# Patient Record
Sex: Female | Born: 2000 | Hispanic: Yes | Marital: Single | Smoking: Former smoker
Health system: Southern US, Community
[De-identification: ages and names within clinical notes are randomized; demographics above are authoritative.]

## PROBLEM LIST (undated history)

## (undated) DIAGNOSIS — T7840XA Allergy, unspecified, initial encounter: Secondary | ICD-10-CM

## (undated) DIAGNOSIS — R51 Headache: Secondary | ICD-10-CM

## (undated) HISTORY — PX: TONSILLECTOMY: SUR1361

---
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
## 2019-01-26 ENCOUNTER — Encounter (HOSPITAL_COMMUNITY): Payer: Self-pay | Admitting: Emergency Medicine

## 2019-01-26 ENCOUNTER — Ambulatory Visit (HOSPITAL_COMMUNITY)
Admission: EM | Admit: 2019-01-26 | Discharge: 2019-01-26 | Disposition: A | Discharge status: Home / Self Care | Payer: Medicaid Other | Attending: Internal Medicine | Admitting: Internal Medicine

## 2019-01-26 DIAGNOSIS — J00 Acute nasopharyngitis [common cold]: Secondary | ICD-10-CM | POA: Diagnosis not present

## 2019-01-26 MED ORDER — IPRATROPIUM BROMIDE 0.06 % NA SOLN: 2.0000 | Freq: Four times a day (QID) | NASAL | 0 refills | Status: DC

## 2019-01-26 MED ORDER — FLUTICASONE PROPIONATE 50 MCG/ACT NA SUSP: 2.0000 | Freq: Every day | NASAL | 0 refills | Status: DC

## 2019-01-26 MED ORDER — CETIRIZINE HCL 10 MG PO TABS: 10.0000 mg | ORAL_TABLET | Freq: Every day | ORAL | 0 refills | Status: DC

## 2019-01-26 NOTE — Discharge Instructions (Signed)
Start flonase, zyrtec, atrovent nasal spray for nasal congestion/drainage. You can use over the counter nasal saline rinse such as neti pot for nasal congestion. Keep hydrated, your urine should be clear to pale yellow in color. Tylenol/motrin for fever and pain. Monitor for any worsening of symptoms, chest pain, shortness of breath, wheezing, swelling of the throat, follow up for reevaluation.   For sore throat/cough try using a honey-based tea. Use 3 teaspoons of honey with juice squeezed from half lemon. Place shaved pieces of ginger into 1/2-1 cup of water and warm over stove top. Then mix the ingredients and repeat every 4 hours as needed.

## 2019-01-26 NOTE — ED Provider Notes (Signed)
[facility]    CSN: [id number] Arrival date & time: 01/26/19  1717     History   Chief Complaint Chief Complaint  Patient presents with  . URI    HPI Rhonda Sparks is a 18 y.o. female.   18 year old female comes in for 2 day history of URI symptoms. Has had nasal congestion, rhinorrhea, cough, sore throat. Denies fever, chills, night sweats. Denies trouble swallowing, trouble breathing, swelling of the throat, tripoding, drooling, trismus. otc cold medicine without relief. States had left over antibiotic and took a few doses without relief.      Past Medical History:  Diagnosis Date  . Allergy    seasonal  . Constipation   . Headache(784.0)    migraines    Patient Active Problem List   Diagnosis Date Noted  . Migraine without aura and without status migrainosus, not intractable 03/26/2015  . New daily persistent headache 03/25/2015  . Migraine with aura and without status migrainosus, not intractable 03/25/2015  . Chronic tension type headache 03/25/2015    Past Surgical History:  Procedure Laterality Date  . FOREIGN BODY REMOVAL  04/18/2012   Procedure: FOREIGN BODY REMOVAL PEDIATRIC;  Surgeon: Harvie Junior, MD;  Location: [facility];  Service: Orthopedics;  Laterality: Left;  left foot 3rd toe  . TONSILLECTOMY     last  year  . TONSILLECTOMY AND ADENOIDECTOMY Bilateral 2012    OB History   No obstetric history on file.      Home Medications    Prior to Admission medications   Medication Sig Start Date End Date Taking? Authorizing Provider  Acetaminophen-Caff-Pyrilamine (MIDOL COMPLETE PO) Take by mouth. Take 2 tablets PRN    [provider]  cetirizine (ZYRTEC) 10 MG tablet Take 1 tablet (10 mg total) by mouth daily. 01/26/19   Cathie Hoops, Eusebia Grulke V, PA-C  fluticasone (FLONASE) 50 MCG/ACT nasal spray Place 2 sprays into both nostrils daily. 01/26/19   Cathie Hoops, Toye Rouillard V, PA-C  ibuprofen (ADVIL,MOTRIN) 600 MG tablet Take 600 mg by mouth  every 6 (six) hours as needed. PRN for headaches    [provider]  ipratropium (ATROVENT) 0.06 % nasal spray Place 2 sprays into both nostrils 4 (four) times daily. 01/26/19   Belinda Fisher, PA-C    Family History Family History  Problem Relation Age of Onset  . Arthritis Mother   . Arthritis Maternal Grandmother   . Depression Maternal Grandmother   . Diabetes Maternal Grandmother   . Hyperlipidemia Maternal Grandmother     Social History Social History   Tobacco Use  . Smoking status: Never Smoker  . Smokeless tobacco: Never Used  Substance Use Topics  . Alcohol use: No    Alcohol/week: 0.0 standard drinks  . Drug use: Yes    Frequency: 2.0 times per week    Types: Marijuana     Allergies   Other   Review of Systems Review of Systems  Reason unable to perform ROS: See HPI as above.     Physical Exam Triage Vital Signs ED Triage Vitals  Enc Vitals Group     BP 01/26/19 1745 129/81     Pulse Rate 01/26/19 1745 (!) 104     Resp 01/26/19 1745 18     Temp 01/26/19 1745 98.3 F (36.8 C)     Temp Source 01/26/19 1745 Oral     SpO2 01/26/19 1745 100 %     Weight --  Height --      Head Circumference --      Peak Flow --      Pain Score 01/26/19 1748 5     Pain Loc --      Pain Edu? --      Excl. in GC? --    No data found.  Updated Vital Signs BP 129/81 (BP Location: Right Arm)   Pulse (!) 104   Temp 98.3 F (36.8 C) (Oral)   Resp 18   SpO2 100%   Physical Exam Constitutional:      General: She is not in acute distress.    Appearance: She is well-developed. She is not ill-appearing, toxic-appearing or diaphoretic.  HENT:     Head: Normocephalic and atraumatic.     Right Ear: Tympanic membrane, ear canal and external ear normal. Tympanic membrane is not erythematous or bulging.     Left Ear: Tympanic membrane, ear canal and external ear normal. Tympanic membrane is not erythematous or bulging.     Nose: Congestion and rhinorrhea present.       Right Sinus: Maxillary sinus tenderness and frontal sinus tenderness present.     Left Sinus: Maxillary sinus tenderness and frontal sinus tenderness present.     Mouth/Throat:     Mouth: Mucous membranes are moist.     Pharynx: Oropharynx is clear. Uvula midline.  Eyes:     Conjunctiva/sclera: Conjunctivae normal.     Pupils: Pupils are equal, round, and reactive to light.  Neck:     Musculoskeletal: Normal range of motion and neck supple.  Cardiovascular:     Rate and Rhythm: Normal rate and regular rhythm.     Heart sounds: Normal heart sounds. No murmur. No friction rub. No gallop.   Pulmonary:     Effort: Pulmonary effort is normal. No accessory muscle usage, prolonged expiration, respiratory distress or retractions.     Breath sounds: Normal breath sounds. No stridor, decreased air movement or transmitted upper airway sounds. No decreased breath sounds, wheezing, rhonchi or rales.  Skin:    General: Skin is warm and dry.  Neurological:     Mental Status: She is alert and oriented to person, place, and time.      UC Treatments / Results  Labs (all labs ordered are listed, but only abnormal results are displayed) Labs Reviewed - No data to display  EKG None  Radiology No results found.  Procedures Procedures (including critical care time)  Medications Ordered in UC Medications - No data to display  Initial Impression / Assessment and Plan / UC Course  I have reviewed the triage vital signs and the nursing notes.  Pertinent labs & imaging results that were available during my care of the patient were reviewed by me and considered in my medical decision making (see chart for details).    Discussed with patient history and exam most consistent with viral URI. Symptomatic treatment as needed. Push fluids. Return precautions given.   Final Clinical Impressions(s) / UC Diagnoses   Final diagnoses:  Acute nasopharyngitis   ED Prescriptions    Medication Sig  Dispense Auth. Provider   fluticasone (FLONASE) 50 MCG/ACT nasal spray Place 2 sprays into both nostrils daily. 1 g Maryjayne Kleven V, PA-C   ipratropium (ATROVENT) 0.06 % nasal spray Place 2 sprays into both nostrils 4 (four) times daily. 15 mL Venice Marcucci V, PA-C   cetirizine (ZYRTEC) 10 MG tablet Take 1 tablet (10 mg total) by mouth daily. 15 tablet  Dorena Cookey, Raiden Haydu V, PA-C 01/26/19 [phone]

## 2019-01-26 NOTE — ED Triage Notes (Signed)
Pt sts URI sx x 2 days  

## 2019-02-27 ENCOUNTER — Other Ambulatory Visit: Payer: Self-pay

## 2019-02-27 ENCOUNTER — Emergency Department (HOSPITAL_COMMUNITY): Payer: Medicaid Other

## 2019-02-27 ENCOUNTER — Emergency Department (HOSPITAL_COMMUNITY)
Admission: EM | Admit: 2019-02-27 | Discharge: 2019-02-27 | Disposition: A | Discharge status: Home / Self Care | Payer: Medicaid Other | Attending: Emergency Medicine | Admitting: Emergency Medicine

## 2019-02-27 DIAGNOSIS — R05 Cough: Secondary | ICD-10-CM | POA: Insufficient documentation

## 2019-02-27 DIAGNOSIS — Z79899 Other long term (current) drug therapy: Secondary | ICD-10-CM | POA: Diagnosis not present

## 2019-02-27 DIAGNOSIS — R059 Cough, unspecified: Secondary | ICD-10-CM

## 2019-02-27 MED ORDER — ALBUTEROL SULFATE HFA 108 (90 BASE) MCG/ACT IN AERS
2.0000 | INHALATION_SPRAY | Freq: Once | RESPIRATORY_TRACT | Status: AC
  Administered 2019-02-27: 2 via RESPIRATORY_TRACT
  Filled 2019-02-27: qty 6.7

## 2019-02-27 MED ORDER — CETIRIZINE HCL 10 MG PO TABS: 10.0000 mg | ORAL_TABLET | Freq: Every day | ORAL | 1 refills | Status: DC

## 2019-02-27 NOTE — ED Provider Notes (Signed)
[facility] EMERGENCY DEPARTMENT Provider Note   CSN: [id number] Arrival date & time: 02/27/19  0050    History   Chief Complaint Chief Complaint  Patient presents with  . Shortness of Breath    HPI Rhonda Sparks is a 18 y.o. female.     The history is provided by the patient and medical records.  Shortness of Breath     18 y.o. F with hx of seasonal allergies, headaches, constipation, presenting to the ED for cough and SOB.  States she has had a cough for about 1 month.  States it is mostly dry but intermittently she will have some clear mucus.  She is also had ongoing nasal congestion for about a month.  She is not had any fever, chills, sick contacts.  No recent travel or known COVID exposures.  She denies any chest pain.  She was seen at urgent care about 1 month ago prescribed "antibiotics" but states she did not finish them because she lost the bottle.  She is not sure exactly what it was.  She has no history of asthma or other baseline respiratory issues.  Past Medical History:  Diagnosis Date  . Allergy    seasonal  . Constipation   . Headache(784.0)    migraines    Patient Active Problem List   Diagnosis Date Noted  . Migraine without aura and without status migrainosus, not intractable 03/26/2015  . New daily persistent headache 03/25/2015  . Migraine with aura and without status migrainosus, not intractable 03/25/2015  . Chronic tension type headache 03/25/2015    Past Surgical History:  Procedure Laterality Date  . FOREIGN BODY REMOVAL  04/18/2012   Procedure: FOREIGN BODY REMOVAL PEDIATRIC;  Surgeon: Harvie Junior, MD;  Location: [facility];  Service: Orthopedics;  Laterality: Left;  left foot 3rd toe  . TONSILLECTOMY     last  year  . TONSILLECTOMY AND ADENOIDECTOMY Bilateral 2012     OB History   No obstetric history on file.      Home Medications    Prior to Admission medications   Medication Sig Start  Date End Date Taking? Authorizing Provider  Acetaminophen-Caff-Pyrilamine (MIDOL COMPLETE PO) Take by mouth. Take 2 tablets PRN    [provider]  cetirizine (ZYRTEC) 10 MG tablet Take 1 tablet (10 mg total) by mouth daily. 01/26/19   Cathie Hoops, Amy V, PA-C  fluticasone (FLONASE) 50 MCG/ACT nasal spray Place 2 sprays into both nostrils daily. 01/26/19   Cathie Hoops, Amy V, PA-C  ibuprofen (ADVIL,MOTRIN) 600 MG tablet Take 600 mg by mouth every 6 (six) hours as needed. PRN for headaches    [provider]  ipratropium (ATROVENT) 0.06 % nasal spray Place 2 sprays into both nostrils 4 (four) times daily. 01/26/19   Belinda Fisher, PA-C    Family History Family History  Problem Relation Age of Onset  . Arthritis Mother   . Arthritis Maternal Grandmother   . Depression Maternal Grandmother   . Diabetes Maternal Grandmother   . Hyperlipidemia Maternal Grandmother     Social History Social History   Tobacco Use  . Smoking status: Never Smoker  . Smokeless tobacco: Never Used  Substance Use Topics  . Alcohol use: No    Alcohol/week: 0.0 standard drinks  . Drug use: Yes    Frequency: 2.0 times per week    Types: Marijuana     Allergies   Other   Review of Systems Review of  Systems  Respiratory: Positive for shortness of breath.   All other systems reviewed and are negative.    Physical Exam Updated Vital Signs BP 125/78   Pulse (!) 104   Temp 98.7 F (37.1 C) (Oral)   Resp (!) 22   LMP 01/20/2019   SpO2 100%   Physical Exam Vitals signs and nursing note reviewed.  Constitutional:      Appearance: She is well-developed.  HENT:     Head: Normocephalic and atraumatic.     Right Ear: Tympanic membrane and ear canal normal.     Left Ear: Tympanic membrane and ear canal normal.     Nose: Congestion present.     Mouth/Throat:     Lips: Pink.     Mouth: Mucous membranes are moist.     Pharynx: Oropharynx is clear.  Eyes:     Conjunctiva/sclera: Conjunctivae normal.      Pupils: Pupils are equal, round, and reactive to light.  Neck:     Musculoskeletal: Normal range of motion.  Cardiovascular:     Rate and Rhythm: Normal rate and regular rhythm.     Heart sounds: Normal heart sounds.  Pulmonary:     Effort: Pulmonary effort is normal.     Breath sounds: Wheezing present.     Comments: Faint end expiratory wheeze, no acute distress, speaking in full sentences without difficulty Abdominal:     General: Bowel sounds are normal.     Palpations: Abdomen is soft.  Musculoskeletal: Normal range of motion.     Comments: Left ankle monitor in place  Skin:    General: Skin is warm and dry.  Neurological:     Mental Status: She is alert and oriented to person, place, and time.      ED Treatments / Results  Labs (all labs ordered are listed, but only abnormal results are displayed) Labs Reviewed - No data to display  EKG None  [facility]  Result Date: 02/27/2019 CLINICAL DATA:  Cough. Shortness of breath. EXAM: PORTABLE CHEST 1 VIEW COMPARISON:  04/26/2017 FINDINGS: The cardiomediastinal contours are normal. The lungs are clear. Pulmonary vasculature is normal. No consolidation, pleural effusion, or pneumothorax. No acute osseous abnormalities are seen. IMPRESSION: Negative AP view of the chest. Electronically Signed   By: Narda RutherfordMelanie  Sanford M.D.   On: 02/27/2019 02:32    Procedures Procedures (including critical care time)  Medications Ordered in ED Medications  albuterol (PROVENTIL HFA;VENTOLIN HFA) 108 (90 Base) MCG/ACT inhaler 2 puff (2 puffs Inhalation Given 02/27/19 0152)     Initial Impression / Assessment and Plan / ED Course  I have reviewed the triage vital signs and the nursing notes.  Pertinent labs & imaging results that were available during my care of the patient were reviewed by me and considered in my medical decision making (see chart for details).  18 year old female here with cough for the past month.  She  is also had some nasal congestion, started having shortness of breath the day.  Seen in urgent care last month and reports she was given "antibiotics" however it appears she was prescribed Zyrtec and some prednisone, however never finished these medications as she lost the bottle.  She is not had any fever, chills, or sick exposures.  No recent travel or known COVID exposures.  Her exam is overall benign here aside from mild nasal congestion and faint end expiratory wheezes.  She was given albuterol inhaler with improvement.  Chest x-ray here  is negative.  She does have history of seasonal allergies and symptoms are more consistent with this.  She is not had any noted fever and I feel she is low risk for COVID.  Will restart allergy medications, can continue albuterol inhaler as needed at home.  Follow-up with PCP.  Return here for any new or acute changes.  Rhonda Sparks was evaluated in Emergency Department on 02/27/2019 for the symptoms described in the history of present illness. She was evaluated in the context of the global COVID-19 pandemic, which necessitated consideration that the patient might be at risk for infection with the SARS-CoV-2 virus that causes COVID-19. Institutional protocols and algorithms that pertain to the evaluation of patients at risk for COVID-19 are in a state of rapid change based on information released by regulatory bodies including the CDC and federal and state organizations. These policies and algorithms were followed during the patient's care in the ED.  Final Clinical Impressions(s) / ED Diagnoses   Final diagnoses:  Cough    ED Discharge Orders         Ordered    cetirizine (ZYRTEC ALLERGY) 10 MG tablet  Daily     02/27/19 0243           Garlon Hatchet, PA-C 02/27/19 0256    Nira Conn, MD 02/28/19 [phone]

## 2019-02-27 NOTE — ED Triage Notes (Signed)
Pt reports she feels SHOB x 1 day. Pt reports cough x 1 month, she was seen at [facility] and given antibiotic but she did not finish them. Pt denies fever or sick contact. Pt reports runny nose.

## 2019-02-27 NOTE — Discharge Instructions (Addendum)
Your chest x-ray was normal.   Re-start the zyrtec-- this is an allergy medication, not antibiotic. You can continue using the nasal spray. Follow-up with your primary care doctor.

## 2019-06-04 IMAGING — DX DG CHEST 2V
1 series · 1 of 1 positions shown · non-contrast
Comparison: Thoracic spine radiographs 07/26/2014

CLINICAL DATA: Generalized chest pain, nonproductive cough, nausea,
and fever for 6 days.

EXAM:
CHEST  2 VIEW

[w chest lat]
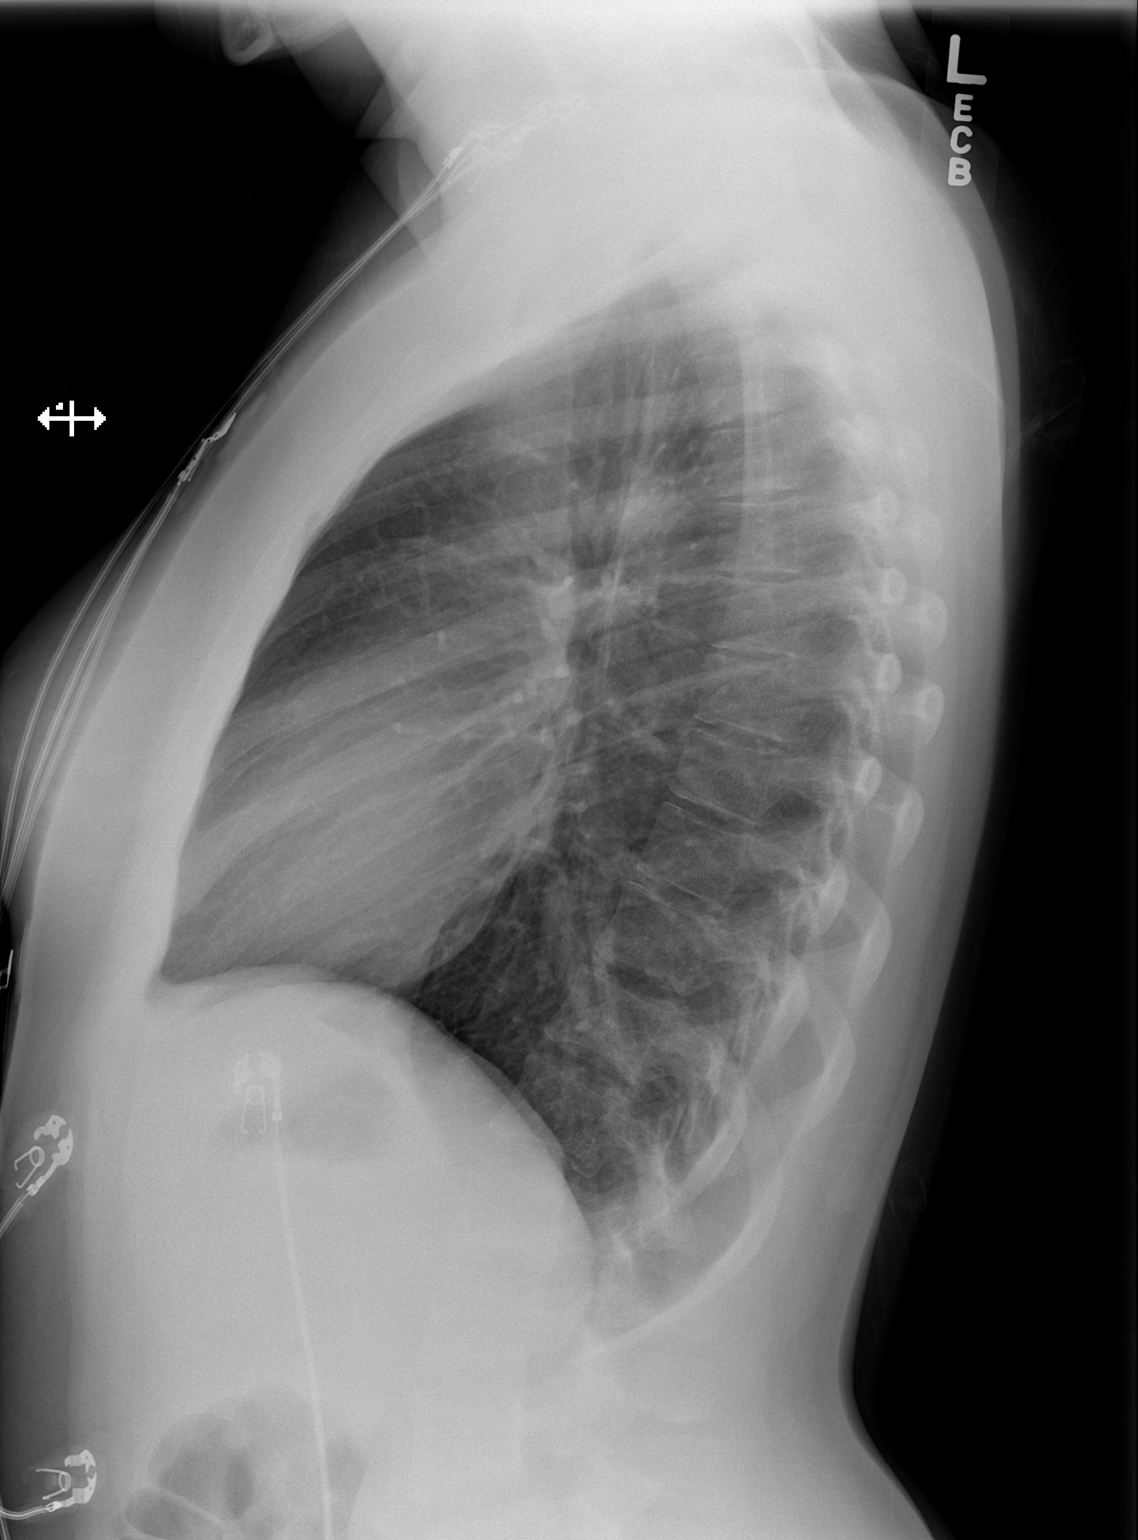

[1 of 1 positions shown; findings below may reference images not displayed]

FINDINGS: The cardiomediastinal silhouette is within normal limits. The lungs
are well inflated and clear. There is no evidence of pleural
effusion or pneumothorax. No acute osseous abnormality is
identified.
IMPRESSION: No active cardiopulmonary disease.

## 2019-08-21 ENCOUNTER — Other Ambulatory Visit: Payer: Self-pay

## 2019-08-21 ENCOUNTER — Encounter (HOSPITAL_COMMUNITY): Payer: Self-pay | Admitting: Emergency Medicine

## 2019-08-21 ENCOUNTER — Emergency Department (HOSPITAL_COMMUNITY): Payer: Medicaid Other

## 2019-08-21 ENCOUNTER — Emergency Department (HOSPITAL_COMMUNITY)
Admission: EM | Admit: 2019-08-21 | Discharge: 2019-08-21 | Disposition: A | Discharge status: Home / Self Care | Payer: Medicaid Other | Attending: Emergency Medicine | Admitting: Emergency Medicine

## 2019-08-21 DIAGNOSIS — Z20828 Contact with and (suspected) exposure to other viral communicable diseases: Secondary | ICD-10-CM | POA: Diagnosis not present

## 2019-08-21 DIAGNOSIS — Z79899 Other long term (current) drug therapy: Secondary | ICD-10-CM | POA: Insufficient documentation

## 2019-08-21 DIAGNOSIS — Z20822 Contact with and (suspected) exposure to covid-19: Secondary | ICD-10-CM

## 2019-08-21 DIAGNOSIS — R0981 Nasal congestion: Secondary | ICD-10-CM | POA: Diagnosis present

## 2019-08-21 MED ORDER — BENZONATATE 100 MG PO CAPS: 100.0000 mg | ORAL_CAPSULE | Freq: Three times a day (TID) | ORAL | 0 refills | Status: DC

## 2019-08-21 MED ORDER — CETIRIZINE HCL 10 MG PO TABS: 10.0000 mg | ORAL_TABLET | Freq: Every day | ORAL | 0 refills | Status: DC

## 2019-08-21 NOTE — ED Notes (Signed)
Pt states she does want to be tested for Covid, will attempt again.

## 2019-08-21 NOTE — ED Provider Notes (Signed)
[facility] EMERGENCY DEPARTMENT Provider Note   CSN: [id number] Arrival date & time: 08/21/19  2111     History   Chief Complaint Chief Complaint  Patient presents with  . Nasal Congestion    HPI Rhonda Sparks is a 18 y.o. female.     The history is provided by the patient. No language interpreter was used.     18 year old female presenting for evaluation of cough and congestion.  Patient report for the past week she endorsed nonproductive cough, sinus congestion, chest congestion, mild throat irritation, decreased taste and smell, occasional sneeze.  She felt her symptoms are similar to her prior bronchitis.  She is an occasional smoker.  She does not complain of fever or chills or body aches.  She denies nausea vomiting or diarrhea.  She denies any recent sick contact with anyone with COVID-19.  She has tried over-the-counter medication at home without adequate relief.  No complaints of shortness of breath or chest pain.  Past Medical History:  Diagnosis Date  . Allergy    seasonal  . Constipation   . Headache(784.0)    migraines    Patient Active Problem List   Diagnosis Date Noted  . Migraine without aura and without status migrainosus, not intractable 03/26/2015  . New daily persistent headache 03/25/2015  . Migraine with aura and without status migrainosus, not intractable 03/25/2015  . Chronic tension type headache 03/25/2015    Past Surgical History:  Procedure Laterality Date  . FOREIGN BODY REMOVAL  04/18/2012   Procedure: FOREIGN BODY REMOVAL PEDIATRIC;  Surgeon: Harvie Junior, MD;  Location: [facility];  Service: Orthopedics;  Laterality: Left;  left foot 3rd toe  . TONSILLECTOMY     last  year  . TONSILLECTOMY AND ADENOIDECTOMY Bilateral 2012     OB History   No obstetric history on file.      Home Medications    Prior to Admission medications   Medication Sig Start Date End Date Taking? Authorizing  Provider  Acetaminophen-Caff-Pyrilamine (MIDOL COMPLETE PO) Take by mouth. Take 2 tablets PRN    [provider]  cetirizine (ZYRTEC ALLERGY) 10 MG tablet Take 1 tablet (10 mg total) by mouth daily. 02/27/19   Garlon Hatchet, PA-C  fluticasone (FLONASE) 50 MCG/ACT nasal spray Place 2 sprays into both nostrils daily. 01/26/19   Cathie Hoops, Amy V, PA-C  ibuprofen (ADVIL,MOTRIN) 600 MG tablet Take 600 mg by mouth every 6 (six) hours as needed. PRN for headaches    [provider]  ipratropium (ATROVENT) 0.06 % nasal spray Place 2 sprays into both nostrils 4 (four) times daily. 01/26/19   Belinda Fisher, PA-C    Family History Family History  Problem Relation Age of Onset  . Arthritis Mother   . Arthritis Maternal Grandmother   . Depression Maternal Grandmother   . Diabetes Maternal Grandmother   . Hyperlipidemia Maternal Grandmother     Social History Social History   Tobacco Use  . Smoking status: Never Smoker  . Smokeless tobacco: Never Used  Substance Use Topics  . Alcohol use: No    Alcohol/week: 0.0 standard drinks  . Drug use: Yes    Frequency: 2.0 times per week    Types: Marijuana     Allergies   Other   Review of Systems Review of Systems   Physical Exam Updated Vital Signs BP 119/64 (BP Location: Right Arm)   Pulse 71   Temp 98.4 F (36.9 C) (  Oral)   Resp 16   LMP 08/14/2019   Physical Exam Vitals signs and nursing note reviewed.  Constitutional:      General: She is not in acute distress.    Appearance: She is well-developed.  HENT:     Head: Atraumatic.     Right Ear: Tympanic membrane normal.     Left Ear: Tympanic membrane normal.     Nose: Nose normal.     Mouth/Throat:     Mouth: Mucous membranes are moist.  Eyes:     Conjunctiva/sclera: Conjunctivae normal.  Neck:     Musculoskeletal: Normal range of motion and neck supple.  Cardiovascular:     Rate and Rhythm: Normal rate and regular rhythm.     Pulses: Normal pulses.     Heart  sounds: Normal heart sounds.  Pulmonary:     Effort: Pulmonary effort is normal.     Breath sounds: Normal breath sounds. No wheezing or rales.  Abdominal:     Palpations: Abdomen is soft.     Tenderness: There is no abdominal tenderness.  Lymphadenopathy:     Cervical: No cervical adenopathy.  Skin:    Findings: No rash.  Neurological:     Mental Status: She is alert.  Psychiatric:        Mood and Affect: Mood normal.      ED Treatments / Results  Labs (all labs ordered are listed, but only abnormal results are displayed) Labs Reviewed - No data to display  EKG None  Radiology Dg Chest Portable 1 View  Result Date: 08/21/2019 CLINICAL DATA:  Cough and congestion EXAM: PORTABLE CHEST 1 VIEW COMPARISON:  February 27, 2019 FINDINGS: The heart size and mediastinal contours are within normal limits. Both lungs are clear. The visualized skeletal structures are unremarkable. IMPRESSION: No acute cardiopulmonary process. Electronically Signed   By: Jonna ClarkBindu  Avutu M.D.   On: 08/21/2019 22:27    Procedures Procedures (including critical care time)  Medications Ordered in ED Medications - No data to display   Initial Impression / Assessment and Plan / ED Course  I have reviewed the triage vital signs and the nursing notes.  Pertinent labs & imaging results that were available during my care of the patient were reviewed by me and considered in my medical decision making (see chart for details).        BP 98/71 (BP Location: Right Arm)   Pulse 75   Temp 98.6 F (37 C) (Oral)   Resp 16   LMP 08/14/2019   SpO2 100%    Final Clinical Impressions(s) / ED Diagnoses   Final diagnoses:  Suspected COVID-19 virus infection    ED Discharge Orders         Ordered    cetirizine (ZYRTEC ALLERGY) 10 MG tablet  Daily     08/21/19 2235    benzonatate (TESSALON) 100 MG capsule  Every 8 hours     08/21/19 2235         10:13 PM Pt here with cold sxs x 1week.  CXR ordered, will  also screen for COVID-19.  Pt otherwise well appearing, no fever, no hypoxia.  [name] was evaluated in Emergency Department on 08/21/2019 for the symptoms described in the history of present illness. She was evaluated in the context of the global COVID-19 pandemic, which necessitated consideration that the patient might be at risk for infection with the SARS-CoV-2 virus that causes COVID-19. Institutional protocols and algorithms that pertain to the evaluation  of patients at risk for COVID-19 are in a state of rapid change based on information released by regulatory bodies including the CDC and federal and state organizations. These policies and algorithms were followed during the patient's care in the ED.    Domenic Moras, PA-C 08/21/19 2236    Blanchie Dessert, MD 08/21/19 2251

## 2019-08-21 NOTE — ED Triage Notes (Signed)
C/o congestion and productive cough with green phlegm x 1 week.

## 2019-08-21 NOTE — Discharge Instructions (Signed)
Your symptoms may be due to a viral upper respiratory infection or it may due to COVID-19.  A test have been sent and you will be notify if test positive in the next 2-3 days.  Follow instruction below.  Return if you have any concerns.  Your Chest Xray is normal today.

## 2019-08-21 NOTE — ED Notes (Signed)
Discharge instructions discussed with Pt. Pt verbalized understanding. Pt stable and ambulatory.    

## 2019-08-21 NOTE — ED Notes (Addendum)
As this RN was about to swab pt for Covid, pt refused. Disregard that sample was clicked off. PA aware.

## 2019-08-22 LAB — SARS CORONAVIRUS 2 (TAT 6-24 HRS): SARS Coronavirus 2: NEGATIVE

## 2019-10-25 ENCOUNTER — Encounter (HOSPITAL_COMMUNITY): Payer: Self-pay | Admitting: Emergency Medicine

## 2019-10-25 ENCOUNTER — Emergency Department (HOSPITAL_COMMUNITY)
Admission: EM | Admit: 2019-10-25 | Discharge: 2019-10-25 | Disposition: A | Discharge status: Home / Self Care | Payer: Medicaid Other | Attending: Emergency Medicine | Admitting: Emergency Medicine

## 2019-10-25 ENCOUNTER — Other Ambulatory Visit: Payer: Self-pay

## 2019-10-25 DIAGNOSIS — Z3A08 8 weeks gestation of pregnancy: Secondary | ICD-10-CM | POA: Insufficient documentation

## 2019-10-25 DIAGNOSIS — Z3490 Encounter for supervision of normal pregnancy, unspecified, unspecified trimester: Secondary | ICD-10-CM

## 2019-10-25 DIAGNOSIS — O99891 Other specified diseases and conditions complicating pregnancy: Secondary | ICD-10-CM | POA: Diagnosis present

## 2019-10-25 LAB — CBC WITH DIFFERENTIAL/PLATELET
Abs Immature Granulocytes: 0.05 10*3/uL (ref 0.00–0.07)
Basophils Absolute: 0 10*3/uL (ref 0.0–0.1)
Basophils Relative: 0 %
Eosinophils Absolute: 0.1 10*3/uL (ref 0.0–0.5)
Eosinophils Relative: 1 %
HCT: 38.8 % (ref 36.0–46.0)
Hemoglobin: 12.9 g/dL (ref 12.0–15.0)
Immature Granulocytes: 0 %
Lymphocytes Relative: 23 %
Lymphs Abs: 3.1 10*3/uL (ref 0.7–4.0)
MCH: 32.4 pg (ref 26.0–34.0)
MCHC: 33.2 g/dL (ref 30.0–36.0)
MCV: 97.5 fL (ref 80.0–100.0)
Monocytes Absolute: 0.7 10*3/uL (ref 0.1–1.0)
Monocytes Relative: 5 %
Neutro Abs: 9.7 10*3/uL — ABNORMAL HIGH (ref 1.7–7.7)
Neutrophils Relative %: 71 %
Platelets: 255 10*3/uL (ref 150–400)
RBC: 3.98 MIL/uL (ref 3.87–5.11)
RDW: 12.9 % (ref 11.5–15.5)
WBC: 13.8 10*3/uL — ABNORMAL HIGH (ref 4.0–10.5)
nRBC: 0 % (ref 0.0–0.2)

## 2019-10-25 LAB — URINALYSIS, ROUTINE W REFLEX MICROSCOPIC
Bacteria, UA: NONE SEEN
Bilirubin Urine: NEGATIVE
Glucose, UA: NEGATIVE mg/dL
Hgb urine dipstick: NEGATIVE
Ketones, ur: 20 mg/dL — AB
Nitrite: NEGATIVE
Protein, ur: NEGATIVE mg/dL
Specific Gravity, Urine: 1.016 (ref 1.005–1.030)
pH: 6 (ref 5.0–8.0)

## 2019-10-25 LAB — HCG, QUANTITATIVE, PREGNANCY: hCG, Beta Chain, Quant, S: 61636 m[IU]/mL — ABNORMAL HIGH (ref <5)

## 2019-10-25 LAB — BASIC METABOLIC PANEL
Anion gap: 10 (ref 5–15)
BUN: 11 mg/dL (ref 6–20)
CO2: 22 mmol/L (ref 22–32)
Calcium: 9.1 mg/dL (ref 8.9–10.3)
Chloride: 102 mmol/L (ref 98–111)
Creatinine, Ser: 0.59 mg/dL (ref 0.44–1.00)
GFR calc Af Amer: >60 mL/min (ref >60)
GFR calc non Af Amer: >60 mL/min (ref >60)
Glucose, Bld: 94 mg/dL (ref 70–99)
Potassium: 3.4 mmol/L — ABNORMAL LOW (ref 3.5–5.1)
Sodium: 134 mmol/L — ABNORMAL LOW (ref 135–145)

## 2019-10-25 NOTE — ED Notes (Signed)
Signature pad not available, pt verbalized understanding of d/c instructions at time of discharge.
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
## 2019-10-28 ENCOUNTER — Ambulatory Visit: Payer: Medicaid Other

## 2019-10-31 LAB — PREGNANCY, URINE: Preg Test, Ur: POSITIVE

## 2019-11-20 NOTE — L&D Delivery Note (Addendum)
OB/GYN Faculty Practice Delivery Note  Rhonda Sparks is a 19 y.o. G1P0 s/p vaginal delivery at [redacted]w[redacted]d. She was admitted for PROM.   SROM: 15h 77m with clear,pink,yellow colored fluid GBS Status: unknown, received adequate PCN ppx    Maximum Maternal Temperature:  Temp (24hrs), Avg:98.4 F (36.9 C), Min:98.1 F (36.7 C), Max:98.7 F (37.1 C)   Labor Progress: Initial SVE: 1/70/-3. Patient received FB and Pitocin and progressed to complete with uncomplicated delivery.   Delivery Date/Time: 05/12/2020 at 1452 Delivery: Called to room and patient was complete and pushing. Head delivered in ROA position. Nuchal cord presentx2. First nuchal reduced followed by delivery of shoulder and body  in usual fashion, followed by reduction of second nuchal. Infant with spontaneous cry, placed on mother's abdomen, dried and stimulated. Cord clamped x 2 after 1-minute delay, and cut by FOB. Cord blood drawn. Placenta delivered spontaneously with gentle cord traction. Fundus firm with massage and Pitocin. Labia, perineum, vagina, and cervix inspected with first degree vaginal laceration which was repair with 3-0 Vicryl in standard fashion.    Placenta: spontaneous and intact  Complications: none  Lacerations: first deg vaginal, repaired  EBL: 400 mL Analgesia: epidural    Infant: APGAR (1 MIN): 9   APGAR (5 MINS): 9    Weight: pending   Ronnald Ramp, MD Family Medicine PGY-1  OB FELLOW DELIVERY ATTESTATION  I was gloved and present for the delivery in its entirety, and I agree with the above resident's note.    Jerilynn Birkenhead, MD [facility] Family Medicine Fellow, [facility] for [employer], [facility]

## 2019-11-27 ENCOUNTER — Encounter: Payer: Self-pay | Admitting: *Deleted

## 2019-11-27 DIAGNOSIS — Z3490 Encounter for supervision of normal pregnancy, unspecified, unspecified trimester: Secondary | ICD-10-CM | POA: Insufficient documentation

## 2019-12-01 ENCOUNTER — Other Ambulatory Visit (HOSPITAL_COMMUNITY)
Admission: RE | Admit: 2019-12-01 | Discharge: 2019-12-01 | Disposition: A | Discharge status: Home / Self Care | Payer: Medicaid Other | Source: Ambulatory Visit | Attending: Nurse Practitioner | Admitting: Nurse Practitioner

## 2019-12-01 ENCOUNTER — Ambulatory Visit: Payer: Self-pay

## 2019-12-01 ENCOUNTER — Ambulatory Visit (INDEPENDENT_AMBULATORY_CARE_PROVIDER_SITE_OTHER): Payer: Medicaid Other | Admitting: Nurse Practitioner

## 2019-12-01 ENCOUNTER — Encounter: Payer: Self-pay | Admitting: Nurse Practitioner

## 2019-12-01 ENCOUNTER — Other Ambulatory Visit: Payer: Self-pay

## 2019-12-01 VITALS — BP 127/59 | HR 90 | Wt 147.2 lb

## 2019-12-01 DIAGNOSIS — Z3480 Encounter for supervision of other normal pregnancy, unspecified trimester: Secondary | ICD-10-CM

## 2019-12-01 DIAGNOSIS — M4300 Spondylolysis, site unspecified: Secondary | ICD-10-CM | POA: Insufficient documentation

## 2019-12-01 DIAGNOSIS — Z3A13 13 weeks gestation of pregnancy: Secondary | ICD-10-CM

## 2019-12-01 DIAGNOSIS — R42 Dizziness and giddiness: Secondary | ICD-10-CM | POA: Diagnosis not present

## 2019-12-01 DIAGNOSIS — G4452 New daily persistent headache (NDPH): Secondary | ICD-10-CM | POA: Diagnosis not present

## 2019-12-01 DIAGNOSIS — O3680X0 Pregnancy with inconclusive fetal viability, not applicable or unspecified: Secondary | ICD-10-CM

## 2019-12-01 DIAGNOSIS — O26891 Other specified pregnancy related conditions, first trimester: Secondary | ICD-10-CM | POA: Diagnosis present

## 2019-12-01 DIAGNOSIS — Z3490 Encounter for supervision of normal pregnancy, unspecified, unspecified trimester: Secondary | ICD-10-CM

## 2019-12-01 DIAGNOSIS — Z23 Encounter for immunization: Secondary | ICD-10-CM

## 2019-12-01 DIAGNOSIS — Z362 Encounter for other antenatal screening follow-up: Secondary | ICD-10-CM | POA: Diagnosis not present

## 2019-12-01 HISTORY — DX: Imprisonment and other incarceration: Z65.1

## 2019-12-01 LAB — POCT URINALYSIS DIP (DEVICE)
Bilirubin Urine: NEGATIVE
Glucose, UA: NEGATIVE mg/dL
Hgb urine dipstick: NEGATIVE
Ketones, ur: NEGATIVE mg/dL
Nitrite: NEGATIVE
Protein, ur: NEGATIVE mg/dL
Specific Gravity, Urine: 1.015 (ref 1.005–1.030)
Urobilinogen, UA: 0.2 mg/dL (ref 0.0–1.0)
pH: 7 (ref 5.0–8.0)
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
## 2019-12-02 LAB — OBSTETRIC PANEL, INCLUDING HIV
Antibody Screen: NEGATIVE
Basophils Absolute: 0 10*3/uL (ref 0.0–0.2)
Basos: 0 %
EOS (ABSOLUTE): 0.2 10*3/uL (ref 0.0–0.4)
Eos: 2 %
HIV Screen 4th Generation wRfx: NONREACTIVE
Hematocrit: 36.6 % (ref 34.0–46.6)
Hemoglobin: 12.5 g/dL (ref 11.1–15.9)
Hepatitis B Surface Ag: NEGATIVE
Immature Grans (Abs): 0 10*3/uL (ref 0.0–0.1)
Immature Granulocytes: 0 %
Lymphocytes Absolute: 2.6 10*3/uL (ref 0.7–3.1)
Lymphs: 23 %
MCH: 33.7 pg — ABNORMAL HIGH (ref 26.6–33.0)
MCHC: 34.2 g/dL (ref 31.5–35.7)
MCV: 99 fL — ABNORMAL HIGH (ref 79–97)
Monocytes Absolute: 0.7 10*3/uL (ref 0.1–0.9)
Monocytes: 6 %
Neutrophils Absolute: 7.5 10*3/uL — ABNORMAL HIGH (ref 1.4–7.0)
Neutrophils: 69 %
Platelets: 232 10*3/uL (ref 150–450)
RBC: 3.71 x10E6/uL — ABNORMAL LOW (ref 3.77–5.28)
RDW: 12.5 % (ref 11.7–15.4)
RPR Ser Ql: NONREACTIVE
Rh Factor: POSITIVE
Rubella Antibodies, IGG: 2.57 index (ref >0.99)
WBC: 11 10*3/uL — ABNORMAL HIGH (ref 3.4–10.8)

## 2019-12-02 LAB — GC/CHLAMYDIA PROBE AMP (~~LOC~~) NOT AT ARMC
Chlamydia: NEGATIVE
Comment: NEGATIVE
Comment: NORMAL
Neisseria Gonorrhea: NEGATIVE

## 2019-12-08 LAB — CULTURE, OB URINE

## 2019-12-08 LAB — URINE CULTURE, OB REFLEX

## 2019-12-09 ENCOUNTER — Encounter: Payer: Self-pay | Admitting: Nurse Practitioner

## 2019-12-09 DIAGNOSIS — O99820 Streptococcus B carrier state complicating pregnancy: Secondary | ICD-10-CM | POA: Insufficient documentation

## 2019-12-14 ENCOUNTER — Encounter: Payer: Self-pay | Admitting: *Deleted

## 2019-12-22 ENCOUNTER — Encounter: Payer: Self-pay | Admitting: *Deleted

## 2019-12-28 ENCOUNTER — Encounter: Payer: Self-pay | Admitting: Lactation Services

## 2019-12-28 NOTE — Progress Notes (Signed)
Panorama needs to be redrawn. Pt has visit 2/9 in office. Unable to reach pt as she is incarcerated at this time.

## 2019-12-29 ENCOUNTER — Other Ambulatory Visit: Payer: Self-pay

## 2019-12-29 ENCOUNTER — Ambulatory Visit (INDEPENDENT_AMBULATORY_CARE_PROVIDER_SITE_OTHER): Payer: Medicaid Other | Admitting: Student

## 2019-12-29 ENCOUNTER — Encounter: Payer: Self-pay | Admitting: General Practice

## 2019-12-29 VITALS — BP 118/83 | HR 101 | Wt 160.0 lb

## 2019-12-29 DIAGNOSIS — Z3A17 17 weeks gestation of pregnancy: Secondary | ICD-10-CM

## 2019-12-29 DIAGNOSIS — Z3482 Encounter for supervision of other normal pregnancy, second trimester: Secondary | ICD-10-CM

## 2019-12-29 DIAGNOSIS — Z3480 Encounter for supervision of other normal pregnancy, unspecified trimester: Secondary | ICD-10-CM
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
## 2019-12-29 NOTE — Patient Instructions (Signed)
Second Trimester of Pregnancy The second trimester is from week 14 through week 27 (months 4 through 6). The second trimester is often a time when you feel your best. Your body has adjusted to being pregnant, and you begin to feel better physically. Usually, morning sickness has lessened or quit completely, you may have more energy, and you may have an increase in appetite. The second trimester is also a time when the fetus is growing rapidly. At the end of the sixth month, the fetus is about 9 inches long and weighs about 1 pounds. You will likely begin to feel the baby move (quickening) between 16 and 20 weeks of pregnancy. Body changes during your second trimester Your body continues to go through many changes during your second trimester. The changes vary from woman to woman.  Your weight will continue to increase. You will notice your lower abdomen bulging out.  You may begin to get stretch marks on your hips, abdomen, and breasts.  You may develop headaches that can be relieved by medicines. The medicines should be approved by your health care provider.  You may urinate more often because the fetus is pressing on your bladder.  You may develop or continue to have heartburn as a result of your pregnancy.  You may develop constipation because certain hormones are causing the muscles that push waste through your intestines to slow down.  You may develop hemorrhoids or swollen, bulging veins (varicose veins).  You may have back pain. This is caused by: ? Weight gain. ? Pregnancy hormones that are relaxing the joints in your pelvis. ? A shift in weight and the muscles that support your balance.  Your breasts will continue to grow and they will continue to become tender.  Your gums may bleed and may be sensitive to brushing and flossing.  Dark spots or blotches (chloasma, mask of pregnancy) may develop on your face. This will likely fade after the baby is born.  A dark line from your  belly button to the pubic area (linea nigra) may appear. This will likely fade after the baby is born.  You may have changes in your hair. These can include thickening of your hair, rapid growth, and changes in texture. Some women also have hair loss during or after pregnancy, or hair that feels dry or thin. Your hair will most likely return to normal after your baby is born. What to expect at prenatal visits During a routine prenatal visit:  You will be weighed to make sure you and the fetus are growing normally.  Your blood pressure will be taken.  Your abdomen will be measured to track your baby's growth.  The fetal heartbeat will be listened to.  Any test results from the previous visit will be discussed. Your health care provider may ask you:  How you are feeling.  If you are feeling the baby move.  If you have had any abnormal symptoms, such as leaking fluid, bleeding, severe headaches, or abdominal cramping.  If you are using any tobacco products, including cigarettes, chewing tobacco, and electronic cigarettes.  If you have any questions. Other tests that may be performed during your second trimester include:  Blood tests that check for: ? Low iron levels (anemia). ? High blood sugar that affects pregnant women (gestational diabetes) between 24 and 28 weeks. ? Rh antibodies. This is to check for a protein on red blood cells (Rh factor).  Urine tests to check for infections, diabetes, or protein in the   urine.  An ultrasound to confirm the proper growth and development of the baby.  An amniocentesis to check for possible genetic problems.  Fetal screens for spina bifida and Down syndrome.  HIV (human immunodeficiency virus) testing. Routine prenatal testing includes screening for HIV, unless you choose not to have this test. Follow these instructions at home: Medicines  Follow your health care provider's instructions regarding medicine use. Specific medicines may be  either safe or unsafe to take during pregnancy.  Take a prenatal vitamin that contains at least 600 micrograms (mcg) of folic acid.  If you develop constipation, try taking a stool softener if your health care provider approves. Eating and drinking   Eat a balanced diet that includes fresh fruits and vegetables, whole grains, good sources of protein such as meat, eggs, or tofu, and low-fat dairy. Your health care provider will help you determine the amount of weight gain that is right for you.  Avoid raw meat and uncooked cheese. These carry germs that can cause birth defects in the baby.  If you have low calcium intake from food, talk to your health care provider about whether you should take a daily calcium supplement.  Limit foods that are high in fat and processed sugars, such as fried and sweet foods.  To prevent constipation: ? Drink enough fluid to keep your urine clear or pale yellow. ? Eat foods that are high in fiber, such as fresh fruits and vegetables, whole grains, and beans. Activity  Exercise only as directed by your health care provider. Most women can continue their usual exercise routine during pregnancy. Try to exercise for 30 minutes at least 5 days a week. Stop exercising if you experience uterine contractions.  Avoid heavy lifting, wear low heel shoes, and practice good posture.  A sexual relationship may be continued unless your health care provider directs you otherwise. Relieving pain and discomfort  Wear a good support bra to prevent discomfort from breast tenderness.  Take warm sitz baths to soothe any pain or discomfort caused by hemorrhoids. Use hemorrhoid cream if your health care provider approves.  Rest with your legs elevated if you have leg cramps or low back pain.  If you develop varicose veins, wear support hose. Elevate your feet for 15 minutes, 3-4 times a day. Limit salt in your diet. Prenatal Care  Write down your questions. Take them to  your prenatal visits.  Keep all your prenatal visits as told by your health care provider. This is important. Safety  Wear your seat belt at all times when driving.  Make a list of emergency phone numbers, including numbers for family, friends, the hospital, and police and fire departments. General instructions  Ask your health care provider for a referral to a local prenatal education class. Begin classes no later than the beginning of month 6 of your pregnancy.  Ask for help if you have counseling or nutritional needs during pregnancy. Your health care provider can offer advice or refer you to specialists for help with various needs.  Do not use hot tubs, steam rooms, or saunas.  Do not douche or use tampons or scented sanitary pads.  Do not cross your legs for long periods of time.  Avoid cat litter boxes and soil used by cats. These carry germs that can cause birth defects in the baby and possibly loss of the fetus by miscarriage or stillbirth.  Avoid all smoking, herbs, alcohol, and unprescribed drugs. Chemicals in these products can affect the formation   and growth of the baby.  Do not use any products that contain nicotine or tobacco, such as cigarettes and e-cigarettes. If you need help quitting, ask your health care provider.  Visit your dentist if you have not gone yet during your pregnancy. Use a soft toothbrush to brush your teeth and be gentle when you floss. Contact a health care provider if:  You have dizziness.  You have mild pelvic cramps, pelvic pressure, or nagging pain in the abdominal area.  You have persistent nausea, vomiting, or diarrhea.  You have a bad smelling vaginal discharge.  You have pain when you urinate. Get help right away if:  You have a fever.  You are leaking fluid from your vagina.  You have spotting or bleeding from your vagina.  You have severe abdominal cramping or pain.  You have rapid weight gain or weight loss.  You have  shortness of breath with chest pain.  You notice sudden or extreme swelling of your face, hands, ankles, feet, or legs.  You have not felt your baby move in over an hour.  You have severe headaches that do not go away when you take medicine.  You have vision changes. Summary  The second trimester is from week 14 through week 27 (months 4 through 6). It is also a time when the fetus is growing rapidly.  Your body goes through many changes during pregnancy. The changes vary from woman to woman.  Avoid all smoking, herbs, alcohol, and unprescribed drugs. These chemicals affect the formation and growth your baby.  Do not use any tobacco products, such as cigarettes, chewing tobacco, and e-cigarettes. If you need help quitting, ask your health care provider.  Contact your health care provider if you have any questions. Keep all prenatal visits as told by your health care provider. This is important. This information is not intended to replace advice given to you by your health care provider. Make sure you discuss any questions you have with your health care provider. Document Revised: 02/27/2019 Document Reviewed: 12/11/2016 Elsevier Patient Education  2020 [employer]. How a Baby Grows During Pregnancy  Pregnancy begins when a female's sperm enters a female's egg (fertilization). Fertilization usually happens in one of the tubes (fallopian tubes) that connect the ovaries to the womb (uterus). The fertilized egg moves down the fallopian tube to the uterus. Once it reaches the uterus, it implants into the lining of the uterus and begins to grow. For the first 10 weeks, the fertilized egg is called an embryo. After 10 weeks, it is called a fetus. As the fetus continues to grow, it receives oxygen and nutrients through tissue (placenta) that grows to support the developing baby. The placenta is the life support system for the baby. It provides oxygen and nutrition and removes waste. Learning as  much as you can about your pregnancy and how your baby is developing can help you enjoy the experience. It can also make you aware of when there might be a problem and when to ask questions. How long does a typical pregnancy last? A pregnancy usually lasts 280 days, or about 40 weeks. Pregnancy is divided into three periods of growth, also called trimesters:  First trimester: 0-12 weeks.  Second trimester: 13-27 weeks.  Third trimester: 28-40 weeks. The day when your baby is ready to be born (full term) is your estimated date of delivery. How does my baby develop month by month? First month  The fertilized egg attaches to the inside  of the uterus.  Some cells will form the placenta. Others will form the fetus.  The arms, legs, brain, spinal cord, lungs, and heart begin to develop.  At the end of the first month, the heart begins to beat. Second month  The bones, inner ear, eyelids, hands, and feet form.  The genitals develop.  By the end of 8 weeks, all major organs are developing. Third month  All of the internal organs are forming.  Teeth develop below the gums.  Bones and muscles begin to grow. The spine can flex.  The skin is transparent.  Fingernails and toenails begin to form.  Arms and legs continue to grow longer, and hands and feet develop.  The fetus is about 3 inches (7.6 cm) long. Fourth month  The placenta is completely formed.  The external sex organs, neck, outer ear, eyebrows, eyelids, and fingernails are formed.  The fetus can hear, swallow, and move its arms and legs.  The kidneys begin to produce urine.  The skin is covered with a white, waxy coating (vernix) and very fine hair (lanugo). Fifth month  The fetus moves around more and can be felt for the first time (quickening).  The fetus starts to sleep and wake up and may begin to suck its finger.  The nails grow to the end of the fingers.  The organ in the digestive system that makes  bile (gallbladder) functions and helps to digest nutrients.  If your baby is a girl, eggs are present in her ovaries. If your baby is a boy, testicles start to move down into his scrotum. Sixth month  The lungs are formed.  The eyes open. The brain continues to develop.  Your baby has fingerprints and toe prints. Your baby's hair grows thicker.  At the end of the second trimester, the fetus is about 9 inches (22.9 cm) long. Seventh month  The fetus kicks and stretches.  The eyes are developed enough to sense changes in light.  The hands can make a grasping motion.  The fetus responds to sound. Eighth month  All organs and body systems are fully developed and functioning.  Bones harden, and taste buds develop. The fetus may hiccup.  Certain areas of the brain are still developing. The skull remains soft. Ninth month  The fetus gains about  lb (0.23 kg) each week.  The lungs are fully developed.  Patterns of sleep develop.  The fetus's head typically moves into a head-down position (vertex) in the uterus to prepare for birth.  The fetus weighs 6-9 lb (2.72-4.08 kg) and is 19-20 inches (48.26-50.8 cm) long. What can I do to have a healthy pregnancy and help my baby develop? General instructions  Take prenatal vitamins as directed by your health care provider. These include vitamins such as folic acid, iron, calcium, and vitamin D. They are important for healthy development.  Take medicines only as directed by your health care provider. Read labels and ask a pharmacist or your health care provider whether over-the-counter medicines, supplements, and prescription drugs are safe to take during pregnancy.  Keep all follow-up visits as directed by your health care provider. This is important. Follow-up visits include prenatal care and screening tests. How do I know if my baby is developing well? At each prenatal visit, your health care provider will do several different tests  to check on your health and keep track of your baby's development. These include:  Fundal height and position. ? Your health care provider  will measure your growing belly from your pubic bone to the top of the uterus using a tape measure. ? Your health care provider will also feel your belly to determine your baby's position.  Heartbeat. ? An ultrasound in the first trimester can confirm pregnancy and show a heartbeat, depending on how far along you are. ? Your health care provider will check your baby's heart rate at every prenatal visit.  Second trimester ultrasound. ? This ultrasound checks your baby's development. It also may show your baby's gender. What should I do if I have concerns about my baby's development? Always talk with your health care provider about any concerns that you may have about your pregnancy and your baby. Summary  A pregnancy usually lasts 280 days, or about 40 weeks. Pregnancy is divided into three periods of growth, also called trimesters.  Your health care provider will monitor your baby's growth and development throughout your pregnancy.  Follow your health care provider's recommendations about taking prenatal vitamins and medicines during your pregnancy.  Talk with your health care provider if you have any concerns about your pregnancy or your developing baby. This information is not intended to replace advice given to you by your health care provider. Make sure you discuss any questions you have with your health care provider. Document Revised: 02/26/2019 Document Reviewed: 09/18/2017 Elsevier Patient Education  2020 [employer].

## 2019-12-31 LAB — AFP, SERUM, OPEN SPINA BIFIDA
AFP MoM: 1
AFP Value: 39.6 ng/mL
Gest. Age on Collection Date: 17.6 weeks
Maternal Age At EDD: 19.9 yr
OSBR Risk 1 IN: 10000
Test Results:: NEGATIVE
Weight: 160 [lb_av]

## 2020-01-11 ENCOUNTER — Other Ambulatory Visit: Payer: Self-pay

## 2020-01-13 ENCOUNTER — Ambulatory Visit (HOSPITAL_COMMUNITY)
Admission: RE | Admit: 2020-01-13 | Discharge: 2020-01-13 | Disposition: A | Discharge status: Home / Self Care | Payer: Medicaid Other | Source: Ambulatory Visit | Attending: Obstetrics and Gynecology | Admitting: Obstetrics and Gynecology

## 2020-01-13 ENCOUNTER — Other Ambulatory Visit: Payer: Self-pay

## 2020-01-13 ENCOUNTER — Ambulatory Visit (HOSPITAL_COMMUNITY): Payer: Medicaid Other

## 2020-01-13 ENCOUNTER — Other Ambulatory Visit: Payer: Self-pay | Admitting: Student

## 2020-01-13 DIAGNOSIS — Z3A20 20 weeks gestation of pregnancy: Secondary | ICD-10-CM | POA: Diagnosis not present

## 2020-01-13 DIAGNOSIS — Z3480 Encounter for supervision of other normal pregnancy, unspecified trimester: Secondary | ICD-10-CM | POA: Diagnosis present

## 2020-01-13 DIAGNOSIS — Z363 Encounter for antenatal screening for malformations: Secondary | ICD-10-CM

## 2020-01-26 ENCOUNTER — Encounter: Payer: Medicaid Other | Admitting: Family Medicine

## 2020-01-26 ENCOUNTER — Telehealth: Payer: Self-pay | Admitting: Family Medicine

## 2020-01-26 ENCOUNTER — Other Ambulatory Visit: Payer: Self-pay

## 2020-01-26 DIAGNOSIS — Z3480 Encounter for supervision of other normal pregnancy, unspecified trimester: Secondary | ICD-10-CM

## 2020-01-26 MED ORDER — PRENATAL PLUS 27-1 MG PO TABS: 1.0000 | ORAL_TABLET | Freq: Every day | ORAL | 6 refills | Status: DC

## 2020-01-26 NOTE — Progress Notes (Signed)
Pt called and requested prenatal vitamins be sent to her pharmacy. Prenatal vitamins ordered per protocol.

## 2020-01-26 NOTE — Telephone Encounter (Signed)
Called the patient to reschedule the missed appointment. The patient verbalized understanding.

## 2020-02-02 ENCOUNTER — Ambulatory Visit (INDEPENDENT_AMBULATORY_CARE_PROVIDER_SITE_OTHER): Payer: Medicaid Other | Admitting: Medical

## 2020-02-02 ENCOUNTER — Encounter: Payer: Self-pay | Admitting: Medical

## 2020-02-02 ENCOUNTER — Inpatient Hospital Stay (HOSPITAL_COMMUNITY)
Admission: EM | Admit: 2020-02-02 | Discharge: 2020-02-02 | Disposition: A | Discharge status: Home / Self Care | Payer: Medicaid Other | Attending: Family Medicine | Admitting: Family Medicine

## 2020-02-02 ENCOUNTER — Other Ambulatory Visit: Payer: Self-pay

## 2020-02-02 ENCOUNTER — Encounter (HOSPITAL_COMMUNITY): Payer: Self-pay | Admitting: Family Medicine

## 2020-02-02 VITALS — BP 130/68 | HR 106 | Wt 173.2 lb

## 2020-02-02 DIAGNOSIS — Z3A22 22 weeks gestation of pregnancy: Secondary | ICD-10-CM

## 2020-02-02 DIAGNOSIS — M545 Low back pain: Secondary | ICD-10-CM | POA: Insufficient documentation

## 2020-02-02 DIAGNOSIS — Z87891 Personal history of nicotine dependence: Secondary | ICD-10-CM | POA: Diagnosis not present

## 2020-02-02 DIAGNOSIS — W010XXA Fall on same level from slipping, tripping and stumbling without subsequent striking against object, initial encounter: Secondary | ICD-10-CM | POA: Diagnosis not present

## 2020-02-02 DIAGNOSIS — Y9301 Activity, walking, marching and hiking: Secondary | ICD-10-CM | POA: Insufficient documentation

## 2020-02-02 DIAGNOSIS — IMO0002 Reserved for concepts with insufficient information to code with codable children: Secondary | ICD-10-CM

## 2020-02-02 DIAGNOSIS — Z3492 Encounter for supervision of normal pregnancy, unspecified, second trimester: Secondary | ICD-10-CM

## 2020-02-02 DIAGNOSIS — O9A212 Injury, poisoning and certain other consequences of external causes complicating pregnancy, second trimester: Secondary | ICD-10-CM

## 2020-02-02 DIAGNOSIS — O26892 Other specified pregnancy related conditions, second trimester: Secondary | ICD-10-CM | POA: Insufficient documentation

## 2020-02-02 DIAGNOSIS — O99820 Streptococcus B carrier state complicating pregnancy: Secondary | ICD-10-CM

## 2020-02-02 DIAGNOSIS — Z0489 Encounter for examination and observation for other specified reasons: Secondary | ICD-10-CM

## 2020-02-02 MED ORDER — BLOOD PRESSURE KIT: 1.0000 [IU] | PACK | 0 refills | Status: DC

## 2020-02-02 NOTE — Patient Instructions (Addendum)
Second Trimester of Pregnancy  The second trimester is from week 14 through week 27 (month 4 through 6). This is often the time in pregnancy that you feel your best. Often times, morning sickness has lessened or quit. You may have more energy, and you may get hungry more often. Your unborn baby is growing rapidly. At the end of the sixth month, he or she is about 9 inches long and weighs about 1 pounds. You will likely feel the baby move between 18 and 20 weeks of pregnancy. Follow these instructions at home: Medicines  Take over-the-counter and prescription medicines only as told by your doctor. Some medicines are safe and some medicines are not safe during pregnancy.  Take a prenatal vitamin that contains at least 600 micrograms (mcg) of folic acid.  If you have trouble pooping (constipation), take medicine that will make your stool soft (stool softener) if your doctor approves. Eating and drinking   Eat regular, healthy meals.  Avoid raw meat and uncooked cheese.  If you get low calcium from the food you eat, talk to your doctor about taking a daily calcium supplement.  Avoid foods that are high in fat and sugars, such as fried and sweet foods.  If you feel sick to your stomach (nauseous) or throw up (vomit): ? Eat 4 or 5 small meals a day instead of 3 large meals. ? Try eating a few soda crackers. ? Drink liquids between meals instead of during meals.  To prevent constipation: ? Eat foods that are high in fiber, like fresh fruits and vegetables, whole grains, and beans. ? Drink enough fluids to keep your pee (urine) clear or pale yellow. Activity  Exercise only as told by your doctor. Stop exercising if you start to have cramps.  Do not exercise if it is too hot, too humid, or if you are in a place of great height (high altitude).  Avoid heavy lifting.  Wear low-heeled shoes. Sit and stand up straight.  You can continue to have sex unless your doctor tells you not  to. Relieving pain and discomfort  Wear a good support bra if your breasts are tender.  Take warm water baths (sitz baths) to soothe pain or discomfort caused by hemorrhoids. Use hemorrhoid cream if your doctor approves.  Rest with your legs raised if you have leg cramps or low back pain.  If you develop puffy, bulging veins (varicose veins) in your legs: ? Wear support hose or compression stockings as told by your doctor. ? Raise (elevate) your feet for 15 minutes, 3-4 times a day. ? Limit salt in your food. Prenatal care  Write down your questions. Take them to your prenatal visits.  Keep all your prenatal visits as told by your doctor. This is important. Safety  Wear your seat belt when driving.  Make a list of emergency phone numbers, including numbers for family, friends, the hospital, and police and fire departments. General instructions  Ask your doctor about the right foods to eat or for help finding a counselor, if you need these services.  Ask your doctor about local prenatal classes. Begin classes before month 6 of your pregnancy.  Do not use hot tubs, steam rooms, or saunas.  Do not douche or use tampons or scented sanitary pads.  Do not cross your legs for long periods of time.  Visit your dentist if you have not done so. Use a soft toothbrush to brush your teeth. Floss gently.  Avoid all smoking, herbs,   and alcohol. Avoid drugs that are not approved by your doctor.  Do not use any products that contain nicotine or tobacco, such as cigarettes and e-cigarettes. If you need help quitting, ask your doctor.  Avoid cat litter boxes and soil used by cats. These carry germs that can cause birth defects in the baby and can cause a loss of your baby (miscarriage) or stillbirth. Contact a doctor if:  You have mild cramps or pressure in your lower belly.  You have pain when you pee (urinate).  You have bad smelling fluid coming from your vagina.  You continue to  feel sick to your stomach (nauseous), throw up (vomit), or have watery poop (diarrhea).  You have a nagging pain in your belly area.  You feel dizzy. Get help right away if:  You have a fever.  You are leaking fluid from your vagina.  You have spotting or bleeding from your vagina.  You have severe belly cramping or pain.  You lose or gain weight rapidly.  You have trouble catching your breath and have chest pain.  You notice sudden or extreme puffiness (swelling) of your face, hands, ankles, feet, or legs.  You have not felt the baby move in over an hour.  You have severe headaches that do not go away when you take medicine.  You have trouble seeing. Summary  The second trimester is from week 14 through week 27 (months 4 through 6). This is often the time in pregnancy that you feel your best.  To take care of yourself and your unborn baby, you will need to eat healthy meals, take medicines only if your doctor tells you to do so, and do activities that are safe for you and your baby.  Call your doctor if you get sick or if you notice anything unusual about your pregnancy. Also, call your doctor if you need help with the right food to eat, or if you want to know what activities are safe for you. This information is not intended to replace advice given to you by your health care provider. Make sure you discuss any questions you have with your health care provider. Document Revised: 02/27/2019 Document Reviewed: 12/11/2016 Elsevier Patient Education  2020 [place] Education Options: [facility] Department Classes:  Childbirth education classes can help you get ready for a positive parenting experience. You can also meet other expectant parents and get free stuff for your baby. Each class runs for five weeks on the same night and costs $45 for the mother-to-be and her support person. Medicaid covers the cost if you are eligible. Call [phone] to  register. [facility] Childbirth Education:  [phone] or 9123684812 or [email or web address]  Baby & Me Class: Discuss newborn & infant parenting and family adjustment issues with other new mothers in a relaxed environment. Each week brings a new speaker or baby-centered activity. We encourage new mothers to join [place] every Thursday at 11:00am. Babies birth until crawling. No registration or fee. Daddy [employer]: This course offers Dads-to-be the tools and knowledge needed to feel confident on their journey to becoming new fathers. Experienced dads, who have been trained as coaches, teach dads-to-be how to hold, comfort, diaper, swaddle and play with their infant while being able to support the new mom as well. A class for men taught by men. $25/dad Big Brother/Big Sister: Let your children share in the joy of a new brother or sister in this special class designed just for them.  Class includes discussion about how families care for babies: swaddling, holding, diapering, safety as well as how they can be helpful in their new role. This class is designed for children ages 40 to 23, but any age is welcome. Please register each child individually. $5/child  Mom Talk: This mom-led group offers support and connection to mothers as they journey through the adjustments and struggles of that sometimes overwhelming first year after the birth of a child. Tuesdays at 10:00am and Thursdays at 6:00pm. Babies welcome. No registration or fee. Breastfeeding Support Group: This group is a mother-to-mother support circle where moms have the opportunity to share their breastfeeding experiences. A Lactation Consultant is present for questions and concerns. Meets each Tuesday at 11:00am. No fee or registration. Breastfeeding Your Baby: Learn what to expect in the first days of breastfeeding your newborn.  This class will help you feel more confident with the skills needed to begin your breastfeeding experience. Many  new mothers are concerned about breastfeeding after leaving the hospital. This class will also address the most common fears and challenges about breastfeeding during the first few weeks, months and beyond. (call for fee) Comfort Techniques and Tour: This 2 hour interactive class will provide you the opportunity to learn & practice hands-on techniques that can help relieve some of the discomfort of labor and encourage your baby to rotate toward the best position for birth. You and your partner will be able to try a variety of labor positions with birth balls and rebozos as well as practice breathing, relaxation, and visualization techniques. A tour of the [facility] is included with this class. $20 per registrant and support person Childbirth Class- Weekend Option: This class is a Weekend version of our Birth & Baby series. It is designed for parents who have a difficult time fitting several weeks of classes into their schedule. It covers the care of your newborn and the basics of labor and childbirth. It also includes a [place] [facility] and lunch. The class is held two consecutive days: beginning on Friday evening from 6:30 - 8:30 p.m. and the next day, Saturday from 9 a.m. - 4 p.m. (call for fee) Doren Custard Class: Interested in a waterbirth?  This informational class will help you discover whether waterbirth is the right fit for you. Education about waterbirth itself, supplies you would need and how to assemble your support team is what you can expect from this class. Some obstetrical practices require this class in order to pursue a waterbirth. (Not all obstetrical practices offer waterbirth-check with your healthcare provider.) Register only the expectant mom, but you are encouraged to bring your partner to class! Required if planning waterbirth, no fee. Infant/Child CPR: Parents, grandparents, babysitters, and friends learn Cardio-Pulmonary  Resuscitation skills for infants and children. You will also learn how to treat both conscious and unconscious choking in infants and children. This Family & Friends program does not offer certification. Register each participant individually to ensure that enough mannequins are available. (Call for fee) Grandparent Love: Expecting a grandbaby? This class is for you! Learn about the latest infant care and safety recommendations and ways to support your own child as he or she transitions into the parenting role. Taught by Registered Nurses who are childbirth instructors, but most importantly...they are grandmothers too! $10/person. Childbirth Class- Natural Childbirth: This series of 5 weekly classes is for expectant parents who want to learn and practice natural methods of coping with the process of  labor and childbirth. Relaxation, breathing, massage, visualization, role of the partner, and helpful positioning are highlighted. Participants learn how to be confident in their body's ability to give birth. This class will empower and help parents make informed decisions about their own care. Includes discussion that will help new parents transition into the immediate postpartum period. [facility] is included. We suggest taking this class between 25-32 weeks, but it's only a recommendation. $75 per registrant and one support person or $30 Medicaid. Childbirth Class- 3 week Series: This option of 3 weekly classes helps you and your labor partner prepare for childbirth. Newborn care, labor & birth, cesarean birth, pain management, and comfort techniques are discussed and a [place] [facility] is included. The class meets at the same time, on the same day of the week for 3 consecutive weeks beginning with the starting date you choose. $60 for registrant and one support person.  Marvelous Multiples: Expecting twins, triplets, or more? This class covers the  differences in labor, birth, parenting, and breastfeeding issues that face multiples' parents. NICU tour is included. Led by a Certified Childbirth Educator who is the mother of twins. No fee. Caring for Baby: This class is for expectant and adoptive parents who want to learn and practice the most up-to-date newborn care for their babies. Focus is on birth through the first six weeks of life. Topics include feeding, bathing, diapering, crying, umbilical cord care, circumcision care and safe sleep. Parents learn to recognize symptoms of illness and when to call the pediatrician. Register only the mom-to-be and your partner or support person can plan to come with you! $10 per registrant and support person Childbirth Class- online option: This online class offers you the freedom to complete a Birth and Baby series in the comfort of your own home. The flexibility of this option allows you to review sections at your own pace, at times convenient to you and your support people. It includes additional video information, animations, quizzes, and extended activities. Get organized with helpful eClass tools, checklists, and trackers. Once you register online for the class, you will receive an email within a few days to accept the invitation and begin the class when the time is right for you. The content will be available to you for 60 days. $60 for 60 days of online access for you and your support people.  Local Doulas: Natural Baby Doulas [email or web address] Tel: [phone] [email or web address] [phone] [email or web address] The Labor Hassell Halim  (also do waterbirth tub rental) [phone] [email or web address] [facility] Birth Doula [phone] [email or web address].fi [facility] Rhythms  [phone] [email or web address] [employer]  (PADA) [email or web address] [place] Birth [facility] Baby  [email or web address] Considering Waterbirth? Guide for patients at [facility] [employer]  Why consider waterbirth?  . Gentle birth for babies . Less pain medicine used in labor . May allow for passive descent/less pushing . May reduce perineal tears  . More mobility and instinctive maternal position changes . Increased maternal relaxation . Reduced blood pressure in labor  Is waterbirth safe? What are the risks of infection, drowning or other complications?  . Infection: o Very low risk (3.7 % for tub vs 4.8% for bed) o 7 in 8000 waterbirths with documented infection o Poorly cleaned equipment most common cause o Slightly lower group B strep transmission rate  . Drowning o Maternal:  - Very low risk   - Related to seizures or fainting o Newborn:  - Very  low risk. No evidence of increased risk of respiratory problems in multiple large studies - Physiological protection from breathing under water - Avoid underwater birth if there are any fetal complications - Once baby's head is out of the water, keep it out.  . Birth complication o Some reports of cord trauma, but risk decreased by bringing baby to surface gradually o No evidence of increased risk of shoulder dystocia. Mothers can usually change positions faster in water than in a bed, possibly aiding the maneuvers to free the shoulder.   You must attend a Doren Custard class at [facility]  3rd Wednesday of every month from 7-9pm  [employer] by calling [phone] or online at [email or web address]  Bring [place] the certificate from the class to your prenatal appointment  Meet with a midwife at 36 weeks to see if you can still plan a waterbirth and to sign the consent.   Purchase or rent the following supplies:   Water Birth Pool (Birth Pool in a Box or [place] for instance)  (Tubs start  ~$125)  Single-use disposable tub liner designed for your brand of tub  New garden hose labeled "lead-free", "suitable for drinking water",  Electric drain pump to remove water (We recommend 792 gallon per hour or greater pump.)   Separate garden hose to remove the dirty water  Fish net  Bathing suit top (optional)  Long-handled mirror (optional)  Places to purchase or rent supplies  GotWebTools.is for tub purchases and supplies  Waterbirthsolutions.com for tub purchases and supplies  The Labor Ladies ([email or web address]) $275 for tub rental/set-up & take down/kit   [employer] (http://www.[place].com/.htm) Information regarding doulas (labor support) who provide pool rentals  Our practice has a Birth Pool in a Box tub at the hospital that you may borrow on a first-come-first-served basis. It is your responsibility to to set up, clean and break down the tub. We cannot guarantee the availability of this tub in advance. You are responsible for bringing all accessories listed above. If you do not have all necessary supplies you cannot have a waterbirth.    Things that would prevent you from having a waterbirth:  Premature, <37wks  Previous cesarean birth  Presence of thick meconium-stained fluid  Multiple gestation (Twins, triplets, etc.)  Uncontrolled diabetes or gestational diabetes requiring medication  Hypertension requiring medication or diagnosis of pre-eclampsia  Heavy vaginal bleeding  Non-reassuring fetal heart rate  Active infection (MRSA, etc.). Group B Strep is NOT a contraindication for  waterbirth.  If your labor has to be induced and induction method requires continuous  monitoring of the baby's heart rate  Other risks/issues identified by your obstetrical provider  Please remember that birth is unpredictable. Under certain unforeseeable circumstances your provider may advise against giving birth in the tub. These  decisions will be made on a case-by-case basis and with the safety of you and your baby as our highest priority.

## 2020-02-02 NOTE — MAU Provider Note (Signed)
Chief Complaint: Pregnant Fall   First Provider Initiated Contact with Patient 02/02/20 1940     SUBJECTIVE HPI: Rhonda Sparks is a 19 y.o. G1P0 at 16w6dwho presents to Maternity Admissions reporting fall. [facility] States earlier today she slipped while walking down stairs & landed on her bottom, then slid down remaining 3 stairs. Did not hit her abdomen. Has had some low back pain since then. Also some lower abdominal soreness that comes & goes. Denies vaginal bleeding or LOF. Has not treated symptoms.   Location: back  Quality: dull, aching Severity: 5/10 on pain scale Duration: <1 day Timing: constant Modifying factors: none Associated signs and symptoms: none  Past Medical History:  Diagnosis Date  . Allergy    seasonal  . Constipation   . Headache(784.0)    migraines   OB History  Gravida Para Term Preterm AB Living  1            SAB TAB Ectopic Multiple Live Births               # Outcome Date GA Lbr Len/2nd Weight Sex Delivery Anes PTL Lv  1 Current            Past Surgical History:  Procedure Laterality Date  . FOREIGN BODY REMOVAL  04/18/2012   Procedure: FOREIGN BODY REMOVAL PEDIATRIC;  Surgeon: JAlta Corning MD;  [facility]  Service: Orthopedics;  Laterality: Left;  left foot 3rd toe  . TONSILLECTOMY     last  year  . TONSILLECTOMY AND ADENOIDECTOMY Bilateral 2012   Social History   Socioeconomic History  . Marital status: Single    Spouse name: Not on file  . Number of children: Not on file  . Years of education: Not on file  . Highest education level: Not on file  Occupational History  . Not on file  Tobacco Use  . Smoking status: Former Smoker    Types: Cigarettes  . Smokeless tobacco: Never Used  Substance and Sexual Activity  . Alcohol use: Not Currently    Alcohol/week: 0.0 standard drinks  . Drug use: Not Currently    Frequency: 2.0 times per week    Types: Marijuana  . Sexual activity: Not  Currently  Other Topics Concern  . Not on file  Social History Narrative  . Not on file   Social Determinants of Health   Financial Resource Strain:   . Difficulty of Paying Living Expenses:   Food Insecurity:   . Worried About RCharity fundraiserin the Last Year:   . RArboriculturistin the Last Year:   Transportation Needs:   . LFilm/video editor(Medical):   .Marland KitchenLack of Transportation (Non-Medical):   Physical Activity:   . Days of Exercise per Week:   . Minutes of Exercise per Session:   Stress:   . Feeling of Stress :   Social Connections:   . Frequency of Communication with Friends and Family:   . Frequency of Social Gatherings with Friends and Family:   . Attends Religious Services:   . Active Member of Clubs or Organizations:   . Attends CArchivistMeetings:   .Marland KitchenMarital Status:   Intimate Partner Violence:   . Fear of Current or Ex-Partner:   . Emotionally Abused:   .Marland KitchenPhysically Abused:   . Sexually Abused:    Family History  Problem Relation Age of Onset  . Arthritis Maternal Grandmother   .  Depression Maternal Grandmother   . Diabetes Maternal Grandmother   . Hyperlipidemia Maternal Grandmother   . Seizures Sister    No current facility-administered medications on file prior to encounter.   Current Outpatient Medications on File Prior to Encounter  Medication Sig Dispense Refill  . prenatal vitamin w/FE, FA (PRENATAL 1 + 1) 27-1 MG TABS tablet Take 1 tablet by mouth daily at 12 noon. 30 tablet 6  . Acetaminophen-Caff-Pyrilamine (MIDOL COMPLETE PO) Take by mouth. Take 2 tablets PRN    . Blood Pressure KIT 1 Units by Does not apply route once a week. 1 kit 0  . fluticasone (FLONASE) 50 MCG/ACT nasal spray Place 2 sprays into both nostrils daily. (Patient not taking: Reported on 12/01/2019) 1 g 0   Allergies  Allergen Reactions  . Other Itching and Cough    Seasonal allergies    I have reviewed patient's Past Medical Hx, Surgical Hx, Family  Hx, Social Hx, medications and allergies.   Review of Systems  Constitutional: Negative.   Gastrointestinal: Positive for abdominal pain. Negative for diarrhea, nausea and vomiting.  Genitourinary: Negative.   Musculoskeletal: Positive for back pain.    OBJECTIVE Patient Vitals for the past 24 hrs:  BP Temp Temp src Pulse Resp SpO2 Height Weight  02/02/20 2002 124/63 98.6 F (37 C) Oral 91 17 -- -- --  02/02/20 1916 117/69 -- -- 98 -- -- -- --  02/02/20 1915 -- 97.9 F (36.6 C) -- -- 18 -- 5' 1" (1.549 m) 78.5 kg  02/02/20 1836 126/74 -- -- -- -- -- -- --  02/02/20 1836 -- -- -- -- -- -- 5' 1" (1.549 m) 78 kg  02/02/20 1829 138/69 98.4 F (36.9 C) Oral (!) 121 18 100 % -- --   Constitutional: Well-developed, well-nourished female in no acute distress.  Cardiovascular: normal rate & rhythm, no murmur Respiratory: normal rate and effort. Lung sounds clear throughout GI: Abd soft, non-tender, Pos BS x 4. No guarding or rebound tenderness MS: Extremities nontender, no edema, normal ROM Neurologic: Alert and oriented x 4.  GU:   Cervix closed/thick. No blood.   LAB RESULTS No results found for this or any previous visit (from the past 24 hour(s)).  IMAGING No results found.  MAU COURSE Orders Placed This Encounter  Procedures  . Discharge patient   No orders of the defined types were placed in this encounter.   MDM FHT present via doppler  Abdomen soft & non tender. Cervix closed/thick. Did not have direct abdominal trauma. Too early for fetal monitoring.   No neuro signs. No red flag symptoms. Discussed tylenol for back pain & 2 day course of ibuprofen.   ASSESSMENT 1. Traumatic injury during pregnancy in second trimester   2. [redacted] weeks gestation of pregnancy     PLAN Discharge home in stable condition. Return to abdominal pain, vaginal bleeding, or LOF Tylenol or ibuprofen for back pain   Allergies as of 02/02/2020      Reactions   Other Itching, Cough    Seasonal allergies      Medication List    STOP taking these medications   fluticasone 50 MCG/ACT nasal spray Commonly known as: FLONASE   MIDOL COMPLETE PO     TAKE these medications   Blood Pressure Kit 1 Units by Does not apply route once a week.   prenatal vitamin w/FE, FA 27-1 MG Tabs tablet Take 1 tablet by mouth daily at 12 noon.  Jorje Guild, NP 02/02/2020  8:05 PM
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
## 2020-02-02 NOTE — ED Provider Notes (Signed)
Pt had fall down sevearl steps - landed on her lower back - has some abd tightness and ttp on my exam focal mid to lower abd - mild tachycarida - FHT's of 152, on my bedside visualized [place].    Cleared for OB for obs   D/w Denny Peon, NP at [facility] - accepts pt.   Eber Hong, MD 02/02/20 [phone]
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
## 2020-02-02 NOTE — [facility] Note (Signed)
I was going down stairs and foot slipped. Pumped down about 3 stairs on my butt. Denies vag bleeding and peed some when I fell but no leaking since then.. Some white dc. Fall occurred about 1630

## 2020-02-09 ENCOUNTER — Ambulatory Visit (HOSPITAL_COMMUNITY)
Admission: RE | Admit: 2020-02-09 | Discharge: 2020-02-09 | Disposition: A | Discharge status: Home / Self Care | Payer: Medicaid Other | Source: Ambulatory Visit | Attending: Obstetrics and Gynecology | Admitting: Obstetrics and Gynecology

## 2020-02-09 ENCOUNTER — Other Ambulatory Visit: Payer: Self-pay

## 2020-02-09 DIAGNOSIS — Z363 Encounter for antenatal screening for malformations: Secondary | ICD-10-CM | POA: Diagnosis not present

## 2020-02-09 DIAGNOSIS — Z0489 Encounter for examination and observation for other specified reasons: Secondary | ICD-10-CM | POA: Insufficient documentation

## 2020-02-09 DIAGNOSIS — IMO0002 Reserved for concepts with insufficient information to code with codable children: Secondary | ICD-10-CM

## 2020-02-09 DIAGNOSIS — Z3A23 23 weeks gestation of pregnancy: Secondary | ICD-10-CM | POA: Diagnosis not present

## 2020-02-10 ENCOUNTER — Other Ambulatory Visit (HOSPITAL_COMMUNITY): Payer: Self-pay | Admitting: *Deleted

## 2020-02-10 DIAGNOSIS — Z362 Encounter for other antenatal screening follow-up: Secondary | ICD-10-CM

## 2020-03-01 ENCOUNTER — Other Ambulatory Visit: Payer: Self-pay

## 2020-03-01 ENCOUNTER — Ambulatory Visit (INDEPENDENT_AMBULATORY_CARE_PROVIDER_SITE_OTHER): Payer: Medicaid Other | Admitting: Obstetrics and Gynecology

## 2020-03-01 ENCOUNTER — Other Ambulatory Visit: Payer: Medicaid Other

## 2020-03-01 ENCOUNTER — Encounter: Payer: Self-pay | Admitting: Obstetrics and Gynecology

## 2020-03-01 VITALS — BP 119/66 | HR 102 | Wt 185.0 lb

## 2020-03-01 DIAGNOSIS — Z3A26 26 weeks gestation of pregnancy: Secondary | ICD-10-CM

## 2020-03-01 DIAGNOSIS — Z3492 Encounter for supervision of normal pregnancy, unspecified, second trimester: Secondary | ICD-10-CM

## 2020-03-01 DIAGNOSIS — O99820 Streptococcus B carrier state complicating pregnancy: Secondary | ICD-10-CM

## 2020-03-01 NOTE — Progress Notes (Signed)
   LOW-RISK PREGNANCY OFFICE VISIT Patient name: Rhonda Sparks MRN [id number]  Date of birth: [birth year] Chief Complaint:   Routine Prenatal Visit  History of Present Illness:   IYONNAH Sparks is a 19 y.o. G1P0 female at [redacted]w[redacted]d with an Estimated Date of Delivery: 06/01/20 being seen today for ongoing management of a low-risk pregnancy.  Today she reports increased pelvic pressure". Contractions: Not present. Vag. Bleeding: None.  Movement: Present. denies leaking of fluid. Review of Systems:   Pertinent items are noted in HPI Denies abnormal vaginal discharge w/ itching/odor/irritation, headaches, visual changes, shortness of breath, chest pain, abdominal pain, severe nausea/vomiting, or problems with urination or bowel movements unless otherwise stated above. Pertinent History Reviewed:  Reviewed past medical,surgical, social, obstetrical and family history.  Reviewed problem list, medications and allergies. Physical Assessment:   Vitals:   03/01/20 0825  BP: 119/66  Pulse: (!) 102  Weight: 185 lb (83.9 kg)  Body mass index is 34.96 kg/m.        Physical Examination:   General appearance: Well appearing, and in no distress  Mental status: Alert, oriented to person, place, and time  Skin: Warm & dry  Cardiovascular: Normal heart rate noted  Respiratory: Normal respiratory effort, no distress  Abdomen: Soft, gravid, nontender  Pelvic: Cervical exam deferred         Extremities: Edema: Trace  Fetal Status: Fetal Heart Rate (bpm): 147 Fundal Height: 27 cm Movement: Present    No results found for this or any previous visit (from the past 24 hour(s)).  Assessment & Plan:  1) Low-risk pregnancy G1P0 at [redacted]w[redacted]d with an Estimated Date of Delivery: 06/01/20   2) Encounter for supervision of low-risk pregnancy in second trimester - 2 hr GTT - CBC - RPR - Patient deferred tdap for today, [facility] to [facility] - Anticipatory guidance for next visit in office   3) Group B  Streptococcus carrier, +RV culture, currently pregnant - Reviewed the need for abx in labor - Explained to patient she will not automatically need a C/S for (+) GBS d/t abx coverage   Meds: No orders of the defined types were placed in this encounter.  Labs/procedures today: 2 hr GTT, CBC, RPR  Plan:  Continue routine obstetrical care   Reviewed: Preterm labor symptoms and general obstetric precautions including but not limited to vaginal bleeding, contractions, leaking of fluid and fetal movement were reviewed in detail with the patient.  All questions were answered. Check bp weekly, let us know if >140/90.   Follow-up: Return in about 6 weeks (around 04/12/2020) for Return OB visit.  No orders of the defined types were placed in this encounter.  Raelyn Mora MSN, CNM 03/01/2020

## 2020-03-01 NOTE — Patient Instructions (Signed)
Healthy Weight Gain During Pregnancy, Adult A certain amount of weight gain during pregnancy is normal and healthy. How much weight you should gain depends on your overall health and a measurement called BMI (body mass index). BMI is an estimate of your body fat based on your height and weight. You can use an online calculator to figure out your BMI, or you can ask your health care provider to calculate it for you at your next visit. Your recommended pregnancy weight gain is based on your pre-pregnancy BMI. General guidelines for a healthy total weight gain during pregnancy are listed below. If your BMI at or before the start of your pregnancy is:  Less than 18.5 (underweight), you should gain 28-40 lb (13-18 kg).  18.5-24.9 (normal weight), you should gain 25-35 lb (11-16 kg).  25-29.9 (overweight), you should gain 15-25 lb (7-11 kg).  30 or higher (obese), you should gain 11-20 lb (5-9 kg). These ranges vary depending on your individual health. If you are carrying more than one baby (multiples), it may be safe to gain more weight than these recommendations. If you gain less weight than recommended, that may be safe as long as your baby is growing and developing normally. How can unhealthy weight gain affect me and my baby? Gaining too much weight during pregnancy can lead to pregnancy complications, such as:  A temporary form of diabetes that develops during pregnancy (gestational diabetes).  High blood pressure during pregnancy and protein in your urine (preeclampsia).  High blood pressure during pregnancy without protein in your urine (gestational hypertension).  Your baby having a high weight at birth, which may: ? Raise your risk of having a more difficult delivery or a surgical delivery (cesarean delivery, or C-section). ? Raise your child's risk of developing obesity during childhood. Not gaining enough weight can be life-threatening for your baby, and it may raise your baby's chances  of:  Being born early (preterm).  Growing more slowly than normal during pregnancy (growth restriction).  Having a low weight at birth. What actions can I take to gain a healthy amount of weight during pregnancy? General instructions  Keep track of your weight gain during pregnancy.  Take over-the-counter and prescription medicines only as told by your health care provider. Take all prenatal supplements as directed.  Keep all health care visits during pregnancy (prenatal visits). These visits are a good time to discuss your weight gain. Your health care provider will weigh you at each visit to make sure you are gaining a healthy amount of weight. Nutrition   Eat a balanced, nutrient-rich diet. Eat plenty of: ? Fruits and vegetables, such as berries and broccoli. ? Whole grains, such as millet, barley, whole-wheat breads and cereals, and oatmeal. ? Low-fat dairy products or non-dairy products such as almond milk or rice milk. ? Protein foods, such as lean meat, chicken, eggs, and legumes (such as peas, beans, soybeans, and lentils).  Avoid foods that are fried or have a lot of fat, salt (sodium), or sugar.  Drink enough fluid to keep your urine pale yellow.  Choose healthy snack and drink options when you are at work or on the go: ? Drink water. Avoid soda, sports drinks, and juices that have added sugar. ? Avoid drinks with caffeine, such as coffee and energy drinks. ? Eat snacks that are high in protein, such as nuts, protein bars, and low-fat yogurt. ? Carry convenient snacks in your purse that do not need refrigeration, such as a pack of   trail mix, an apple, or a granola bar.  If you need help improving your diet, work with a health care provider or a diet and nutrition specialist (dietitian). Activity   Exercise regularly, as told by your health care provider. ? If you were active before becoming pregnant, you may be able to continue your regular fitness activities. ? If  you were not active before pregnancy, you may gradually build up to exercising for 30 or more minutes on most days of the week. This may include walking, swimming, or yoga.  Ask your health care provider what activities are safe for you. Talk with your health care provider about whether you may need to be excused from certain school or work activities. Where to find more information Learn more about managing your weight gain during pregnancy from:  American Pregnancy Association: www.americanpregnancy.org  [place] Department of Agriculture pregnancy weight gain calculator: https://ball-collins.biz/ Summary  Too much weight gain during pregnancy can lead to complications for you and your baby.  Find out your pre-pregnancy BMI to determine how much weight gain is healthy for you.  Eat nutritious foods and stay active.  Keep all of your prenatal visits as told by your health care provider. This information is not intended to replace advice given to you by your health care provider. Make sure you discuss any questions you have with your health care provider. Document Revised: 07/29/2019 Document Reviewed: 07/26/2017 Elsevier Patient Education  2020 [employer]. Eating Plan for Pregnant Women While you are pregnant, your body requires additional nutrition to help support your growing baby. You also have a higher need for some vitamins and minerals, such as folic acid, calcium, iron, and vitamin D. Eating a healthy, well-balanced diet is very important for your health and your baby's health. Your need for extra calories varies for the three 12-month segments of your pregnancy (trimesters). For most women, it is recommended to consume:  150 extra calories a day during the first trimester.  300 extra calories a day during the second trimester.  300 extra calories a day during the third trimester. What are tips for following this plan?   Do not try to lose weight or go on a diet during  pregnancy.  Limit your overall intake of foods that have "empty calories." These are foods that have little nutritional value, such as sweets, desserts, candies, and sugar-sweetened beverages.  Eat a variety of foods (especially fruits and vegetables) to get a full range of vitamins and minerals.  Take a prenatal vitamin to help meet your additional vitamin and mineral needs during pregnancy, specifically for folic acid, iron, calcium, and vitamin D.  Remember to stay active. Ask your health care provider what types of exercise and activities are safe for you.  Practice good food safety and cleanliness. Wash your hands before you eat and after you prepare raw meat. Wash all fruits and vegetables well before peeling or eating. Taking these actions can help to prevent food-borne illnesses that can be very dangerous to your baby, such as listeriosis. Ask your health care provider for more information about listeriosis. What does 150 extra calories look like? Healthy options that provide 150 extra calories each day could be any of the following:  6-8 oz (170-230 g) of plain low-fat yogurt with  cup of berries.  1 apple with 2 teaspoons (11 g) of peanut butter.  Cut-up vegetables with  cup (60 g) of hummus.  8 oz (230 mL) or 1 cup of low-fat chocolate  milk.  1 stick of string cheese with 1 medium orange.  1 peanut butter and jelly sandwich that is made with one slice of whole-wheat bread and 1 tsp (5 g) of peanut butter. For 300 extra calories, you could eat two of those healthy options each day. What is a healthy amount of weight to gain? The right amount of weight gain for you is based on your BMI before you became pregnant. If your BMI:  Was less than 18 (underweight), you should gain 28-40 lb (13-18 kg).  Was 18-24.9 (normal), you should gain 25-35 lb (11-16 kg).  Was 25-29.9 (overweight), you should gain 15-25 lb (7-11 kg).  Was 30 or greater (obese), you should gain 11-20 lb  (5-9 kg). What if I am having twins or multiples? Generally, if you are carrying twins or multiples:  You may need to eat 300-600 extra calories a day.  The recommended range for total weight gain is 25-54 lb (11-25 kg), depending on your BMI before pregnancy.  Talk with your health care provider to find out about nutritional needs, weight gain, and exercise that is right for you. What foods can I eat?  Fruits All fruits. Eat a variety of colors and types of fruit. Remember to wash your fruits well before peeling or eating. Vegetables All vegetables. Eat a variety of colors and types of vegetables. Remember to wash your vegetables well before peeling or eating. Grains All grains. Choose whole grains, such as whole-wheat bread, oatmeal, or brown rice. Meats and other protein foods Lean meats, including chicken, [place], fish, and lean cuts of beef, veal, or pork. If you eat fish or seafood, choose options that are higher in omega-3 fatty acids and lower in mercury, such as salmon, herring, mussels, trout, sardines, pollock, shrimp, crab, and lobster. Tofu. Tempeh. Beans. Eggs. Peanut butter and other nut butters. Make sure that all meats, poultry, and eggs are cooked to food-safe temperatures or "well-done." Two or more servings of fish are recommended each week in order to get the most benefits from omega-3 fatty acids that are found in seafood. Choose fish that are lower in mercury. You can find more information online:  GuamGaming.ch Dairy Pasteurized milk and milk alternatives (such as almond milk). Pasteurized yogurt and pasteurized cheese. Cottage cheese. Sour cream. Beverages Water. Juices that contain 100% fruit juice or vegetable juice. Caffeine-free teas and decaffeinated coffee. Drinks that contain caffeine are okay to drink, but it is better to avoid caffeine. Keep your total caffeine intake to less than 200 mg each day (which is 12 oz or 355 mL of coffee, tea, or soda) or the limit  as told by your health care provider. Fats and oils Fats and oils are okay to include in moderation. Sweets and desserts Sweets and desserts are okay to include in moderation. Seasoning and other foods All pasteurized condiments. The items listed above may not be a complete list of foods and beverages you can eat. Contact a dietitian for more information. What foods are not recommended? Fruits Unpasteurized fruit juices. Vegetables Raw (unpasteurized) vegetable juices. Meats and other protein foods Lunch meats, bologna, hot dogs, or other deli meats. (If you must eat those meats, reheat them until they are steaming hot.) Refrigerated pat, meat spreads from a meat counter, smoked seafood that is found in the refrigerated section of a store. Raw or undercooked meats, poultry, and eggs. Raw fish, such as sushi or sashimi. Fish that have high mercury content, such as tilefish, shark, swordfish,  and king mackerel. To learn more about mercury in fish, talk with your health care provider or look for online resources, such as:  [employer]Gaming.ch Dairy Raw (unpasteurized) milk and any foods that have raw milk in them. Soft cheeses, such as feta, queso blanco, queso fresco, Brie, Camembert cheeses, blue-veined cheeses, and Panela cheese (unless it is made with pasteurized milk, which must be stated on the label). Beverages Alcohol. Sugar-sweetened beverages, such as sodas, teas, or energy drinks. Seasoning and other foods Homemade fermented foods and drinks, such as pickles, sauerkraut, or kombucha drinks. (Store-bought pasteurized versions of these are okay.) Salads that are made in a store or deli, such as ham salad, chicken salad, egg salad, tuna salad, and seafood salad. The items listed above may not be a complete list of foods and beverages you should avoid. Contact a dietitian for more information. Where to find more information To calculate the number of calories you need based on your height,  weight, and activity level, you can use an online calculator such as:  MobileTransition.ch To calculate how much weight you should gain during pregnancy, you can use an online pregnancy weight gain calculator such as:  StreamingFood.com.cy Summary  While you are pregnant, your body requires additional nutrition to help support your growing baby.  Eat a variety of foods, especially fruits and vegetables to get a full range of vitamins and minerals.  Practice good food safety and cleanliness. Wash your hands before you eat and after you prepare raw meat. Wash all fruits and vegetables well before peeling or eating. Taking these actions can help to prevent food-borne illnesses, such as listeriosis, that can be very dangerous to your baby.  Do not eat raw meat or fish. Do not eat fish that have high mercury content, such as tilefish, shark, swordfish, and king mackerel. Do not eat unpasteurized (raw) dairy.  Take a prenatal vitamin to help meet your additional vitamin and mineral needs during pregnancy, specifically for folic acid, iron, calcium, and vitamin D. This information is not intended to replace advice given to you by your health care provider. Make sure you discuss any questions you have with your health care provider. Document Revised: 03/26/2019 Document Reviewed: 08/02/2017 Elsevier Patient Education  2020 [place]. Fetal Movement Counts Patient Name: ________________________________________________ Patient Due Date: ____________________ What is a fetal movement count?  A fetal movement count is the number of times that you feel your baby move during a certain amount of time. This may also be called a fetal kick count. A fetal movement count is recommended for every pregnant woman. You may be asked to start counting fetal movements as early as week 28 of your pregnancy. Pay attention to when your baby is most active. You may  notice your baby's sleep and wake cycles. You may also notice things that make your baby move more. You should do a fetal movement count:  When your baby is normally most active.  At the same time each day. A good time to count movements is while you are resting, after having something to eat and drink. How do I count fetal movements? 1. Find a quiet, comfortable area. Sit, or lie down on your side. 2. Write down the date, the start time and stop time, and the number of movements that you felt between those two times. Take this information with you to your health care visits. 3. Write down your start time when you feel the first movement. 4. Count kicks, flutters, swishes, rolls,  and jabs. You should feel at least 10 movements. 5. You may stop counting after you have felt 10 movements, or if you have been counting for 2 hours. Write down the stop time. 6. If you do not feel 10 movements in 2 hours, contact your health care provider for further instructions. Your health care provider may want to do additional tests to assess your baby's well-being. Contact a health care provider if:  You feel fewer than 10 movements in 2 hours.  Your baby is not moving like he or she usually does. Date: ____________ Start time: ____________ Stop time: ____________ Movements: ____________ Date: ____________ Start time: ____________ Stop time: ____________ Movements: ____________ Date: ____________ Start time: ____________ Stop time: ____________ Movements: ____________ Date: ____________ Start time: ____________ Stop time: ____________ Movements: ____________ Date: ____________ Start time: ____________ Stop time: ____________ Movements: ____________ Date: ____________ Start time: ____________ Stop time: ____________ Movements: ____________ Date: ____________ Start time: ____________ Stop time: ____________ Movements: ____________ Date: ____________ Start time: ____________ Stop time: ____________ Movements:  ____________ Date: ____________ Start time: ____________ Stop time: ____________ Movements: ____________ This information is not intended to replace advice given to you by your health care provider. Make sure you discuss any questions you have with your health care provider. Document Revised: 06/25/2019 Document Reviewed: 06/25/2019 Elsevier Patient Education  2020 Else[employer] Inc. Iron-Rich Diet  Iron is a mineral that helps your body to produce hemoglobin. Hemoglobin is a protein in red blood cells that carries oxygen to your body's tissues. Eating too little iron may cause you to feel weak and tired, and it can increase your risk of infection. Iron is naturally found in many foods, and many foods have iron added to them (iron-fortified foods). You may need to follow an iron-rich diet if you do not have enough iron in your body due to certain medical conditions. The amount of iron that you need each day depends on your age, your sex, and any medical conditions you have. Follow instructions from your health care provider or a diet and nutrition specialist (dietitian) about how much iron you should eat each day. What are tips for following this plan? Reading food labels  Check food labels to see how many milligrams (mg) of iron are in each serving. Cooking  Cook foods in pots and pans that are made from iron.  Take these steps to make it easier for your body to absorb iron from certain foods: ? Soak beans overnight before cooking. ? Soak whole grains overnight and drain them before using. ? Ferment flours before baking, such as by using yeast in bread dough. Meal planning  When you eat foods that contain iron, you should eat them with foods that are high in vitamin C. These include oranges, peppers, tomatoes, potatoes, and mango. Vitamin C helps your body to absorb iron. General information  Take iron supplements only as told by your health care provider. An overdose of iron can be  life-threatening. If you were prescribed iron supplements, take them with orange juice or a vitamin C supplement.  When you eat iron-fortified foods or take an iron supplement, you should also eat foods that naturally contain iron, such as meat, poultry, and fish. Eating naturally iron-rich foods helps your body to absorb the iron that is added to other foods or contained in a supplement.  Certain foods and drinks prevent your body from absorbing iron properly. Avoid eating these foods in the same meal as iron-rich foods or with iron supplements. These foods  include: ? Coffee, black tea, and red wine. ? Milk, dairy products, and foods that are high in calcium. ? Beans and soybeans. ? Whole grains. What foods should I eat? Fruits Prunes. Raisins. Eat fruits high in vitamin C, such as oranges, grapefruits, and strawberries, alongside iron-rich foods. Vegetables Spinach (cooked). Green peas. Broccoli. Fermented vegetables. Eat vegetables high in vitamin C, such as leafy greens, potatoes, bell peppers, and tomatoes, alongside iron-rich foods. Grains Iron-fortified breakfast cereal. Iron-fortified whole-wheat bread. Enriched rice. Sprouted grains. Meats and other proteins Beef liver. Oysters. Beef. Shrimp. [place]. Chicken. Tuna. Sardines. Chickpeas. Nuts. Tofu. Pumpkin seeds. Beverages Tomato juice. Fresh orange juice. Prune juice. Hibiscus tea. Fortified instant breakfast shakes. Sweets and desserts Blackstrap molasses. Seasonings and condiments Tahini. Fermented soy sauce. Other foods Wheat germ. The items listed above may not be a complete list of recommended foods and beverages. Contact a dietitian for more information. What foods should I avoid? Grains Whole grains. Bran cereal. Bran flour. Oats. Meats and other proteins Soybeans. Products made from soy protein. Black beans. Lentils. Mung beans. Split peas. Dairy Milk. Cream. Cheese. Yogurt. Cottage cheese. Beverages Coffee.  Black tea. Red wine. Sweets and desserts Cocoa. Chocolate. Ice cream. Other foods Basil. Oregano. Large amounts of parsley. The items listed above may not be a complete list of foods and beverages to avoid. Contact a dietitian for more information. Summary  Iron is a mineral that helps your body to produce hemoglobin. Hemoglobin is a protein in red blood cells that carries oxygen to your body's tissues.  Iron is naturally found in many foods, and many foods have iron added to them (iron-fortified foods).  When you eat foods that contain iron, you should eat them with foods that are high in vitamin C. Vitamin C helps your body to absorb iron.  Certain foods and drinks prevent your body from absorbing iron properly, such as whole grains and dairy products. You should avoid eating these foods in the same meal as iron-rich foods or with iron supplements. This information is not intended to replace advice given to you by your health care provider. Make sure you discuss any questions you have with your health care provider. Document Revised: 10/18/2017 Document Reviewed: 10/01/2017 Elsevier Patient Education  2020 [employer].

## 2020-03-02 LAB — CBC
Hematocrit: 35.2 % (ref 34.0–46.6)
Hemoglobin: 11.8 g/dL (ref 11.1–15.9)
MCH: 31.9 pg (ref 26.6–33.0)
MCHC: 33.5 g/dL (ref 31.5–35.7)
MCV: 95 fL (ref 79–97)
Platelets: 209 10*3/uL (ref 150–450)
RBC: 3.7 x10E6/uL — ABNORMAL LOW (ref 3.77–5.28)
RDW: 11.8 % (ref 11.7–15.4)
WBC: 12.3 10*3/uL — ABNORMAL HIGH (ref 3.4–10.8)

## 2020-03-02 LAB — RPR: RPR Ser Ql: NONREACTIVE

## 2020-03-02 LAB — GLUCOSE TOLERANCE, 2 HOURS W/ 1HR
Glucose, 1 hour: 142 mg/dL (ref 65–179)
Glucose, 2 hour: 136 mg/dL (ref 65–152)
Glucose, Fasting: 87 mg/dL (ref 65–91)

## 2020-03-02 LAB — HIV ANTIBODY (ROUTINE TESTING W REFLEX): HIV Screen 4th Generation wRfx: NONREACTIVE

## 2020-03-04 ENCOUNTER — Telehealth: Payer: Self-pay

## 2020-03-04 NOTE — Telephone Encounter (Signed)
Pt called stating that she is having pain in her vagina and stomach.  It is hard for her close her legs and walk can someone give her a call.  Attempted to call pt seemed as someone picked up but never responded to my hello.  Called again and left message on voicemail stating that if she continues to have questions or concerns to please give [place] a call our office is currently closed and will reopen on Monday.    Addison Naegeli, RN  03/04/20

## 2020-03-08 ENCOUNTER — Other Ambulatory Visit: Payer: Self-pay

## 2020-03-08 ENCOUNTER — Ambulatory Visit (HOSPITAL_COMMUNITY)
Admission: RE | Admit: 2020-03-08 | Discharge: 2020-03-08 | Disposition: A | Discharge status: Home / Self Care | Payer: Medicaid Other | Source: Ambulatory Visit | Attending: Obstetrics and Gynecology | Admitting: Obstetrics and Gynecology

## 2020-03-08 ENCOUNTER — Other Ambulatory Visit (HOSPITAL_COMMUNITY): Payer: Self-pay | Admitting: *Deleted

## 2020-03-08 DIAGNOSIS — Z363 Encounter for antenatal screening for malformations: Secondary | ICD-10-CM

## 2020-03-08 DIAGNOSIS — Z362 Encounter for other antenatal screening follow-up: Secondary | ICD-10-CM | POA: Diagnosis present

## 2020-03-08 DIAGNOSIS — Z3A27 27 weeks gestation of pregnancy: Secondary | ICD-10-CM

## 2020-03-29 ENCOUNTER — Encounter: Payer: Medicaid Other | Admitting: Nurse Practitioner

## 2020-03-31 ENCOUNTER — Ambulatory Visit (INDEPENDENT_AMBULATORY_CARE_PROVIDER_SITE_OTHER): Payer: Medicaid Other | Admitting: Obstetrics and Gynecology

## 2020-03-31 ENCOUNTER — Other Ambulatory Visit: Payer: Self-pay

## 2020-03-31 DIAGNOSIS — Z3A31 31 weeks gestation of pregnancy: Secondary | ICD-10-CM

## 2020-03-31 DIAGNOSIS — O3663X0 Maternal care for excessive fetal growth, third trimester, not applicable or unspecified: Secondary | ICD-10-CM

## 2020-03-31 DIAGNOSIS — Z3492 Encounter for supervision of normal pregnancy, unspecified, second trimester: Secondary | ICD-10-CM
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
## 2020-03-31 NOTE — Patient Instructions (Signed)
Deciding about Circumcision in Baby Boys  (The Basics)  What is circumcision?   Circumcision is a surgery that removes the skin that covers the tip of the penis, called the "foreskin" Circumcision is usually done when a boy is between 1 and 10 days old. In the [place], circumcision is common. In some other countries, fewer boys are circumcised. Circumcision is a common tradition in some religions.  Should I have my baby boy circumcised?   There is no easy answer. Circumcision has some benefits. But it also has risks. After talking with your doctor, you will have to decide for yourself what is right for your family.  What are the benefits of circumcision?   Circumcised boys seem to have slightly lower rates of: ?Urinary tract infections ?Swelling of the opening at the tip of the penis Circumcised men seem to have slightly lower rates of: ?Urinary tract infections ?Swelling of the opening at the tip of the penis ?Penis cancer ?HIV and other infections that you catch during sex ?Cervical cancer in the women they have sex with Even so, in the [place], the risks of these problems are small - even in boys and men who have not been circumcised. Plus, boys and men who are not circumcised can reduce these extra risks by: ?Cleaning their penis well ?Using condoms during sex  What are the risks of circumcision?  Risks include: ?Bleeding or infection from the surgery ?Damage to or amputation of the penis ?A chance that the doctor will cut off too much or not enough of the foreskin ?A chance that sex won't feel as good later in life Only about 1 out of every 200 circumcisions leads to problems. There is also a chance that your health insurance won't pay for circumcision.  How is circumcision done in baby boys?  First, the baby gets medicine for pain relief. This might be a cream on the skin or a shot into the base of the penis. Next, the doctor cleans the baby's penis well. Then  he or she uses special tools to cut off the foreskin. Finally, the doctor wraps a bandage (called gauze) around the baby's penis. If you have your baby circumcised, his doctor or nurse will give you instructions on how to care for him after the surgery. It is important that you follow those instructions carefully.  

## 2020-04-07 ENCOUNTER — Ambulatory Visit (HOSPITAL_COMMUNITY): Payer: Medicaid Other | Attending: Obstetrics and Gynecology

## 2020-04-07 ENCOUNTER — Other Ambulatory Visit: Payer: Self-pay

## 2020-04-07 DIAGNOSIS — Z362 Encounter for other antenatal screening follow-up: Secondary | ICD-10-CM | POA: Insufficient documentation

## 2020-04-07 DIAGNOSIS — Z3A32 32 weeks gestation of pregnancy: Secondary | ICD-10-CM | POA: Diagnosis not present

## 2020-04-07 DIAGNOSIS — O3663X0 Maternal care for excessive fetal growth, third trimester, not applicable or unspecified: Secondary | ICD-10-CM | POA: Diagnosis not present

## 2020-04-08 ENCOUNTER — Encounter: Payer: Self-pay | Admitting: Medical

## 2020-04-08 ENCOUNTER — Other Ambulatory Visit: Payer: Self-pay | Admitting: *Deleted

## 2020-04-08 ENCOUNTER — Encounter: Payer: Self-pay | Admitting: General Practice

## 2020-04-08 ENCOUNTER — Other Ambulatory Visit: Payer: Self-pay

## 2020-04-08 DIAGNOSIS — O3660X0 Maternal care for excessive fetal growth, unspecified trimester, not applicable or unspecified: Secondary | ICD-10-CM

## 2020-04-08 MED ORDER — FLUCONAZOLE 150 MG PO TABS: 150.0000 mg | ORAL_TABLET | Freq: Once | ORAL | 0 refills | Status: AC

## 2020-04-08 NOTE — Progress Notes (Signed)
Per Dr.Arnold send in Diflucan for patient to take for bad yeast, and to add on for Monday.

## 2020-04-11 ENCOUNTER — Encounter: Payer: Medicaid Other | Admitting: Obstetrics and Gynecology

## 2020-04-15 ENCOUNTER — Encounter: Payer: Medicaid Other | Admitting: Family Medicine

## 2020-04-28 ENCOUNTER — Encounter: Payer: Self-pay | Admitting: Medical

## 2020-05-02 ENCOUNTER — Inpatient Hospital Stay (HOSPITAL_COMMUNITY)
Admission: AD | Admit: 2020-05-02 | Discharge: 2020-05-02 | Disposition: A | Discharge status: Home / Self Care | Payer: Medicaid Other | Attending: Obstetrics & Gynecology | Admitting: Obstetrics & Gynecology

## 2020-05-02 ENCOUNTER — Other Ambulatory Visit: Payer: Self-pay

## 2020-05-02 ENCOUNTER — Inpatient Hospital Stay (HOSPITAL_BASED_OUTPATIENT_CLINIC_OR_DEPARTMENT_OTHER): Payer: Medicaid Other

## 2020-05-02 ENCOUNTER — Encounter (HOSPITAL_COMMUNITY): Payer: Self-pay | Admitting: Obstetrics & Gynecology

## 2020-05-02 DIAGNOSIS — O99891 Other specified diseases and conditions complicating pregnancy: Secondary | ICD-10-CM

## 2020-05-02 DIAGNOSIS — Z3492 Encounter for supervision of normal pregnancy, unspecified, second trimester: Secondary | ICD-10-CM

## 2020-05-02 DIAGNOSIS — Z3A35 35 weeks gestation of pregnancy: Secondary | ICD-10-CM | POA: Diagnosis not present

## 2020-05-02 DIAGNOSIS — O368130 Decreased fetal movements, third trimester, not applicable or unspecified: Secondary | ICD-10-CM | POA: Insufficient documentation

## 2020-05-02 DIAGNOSIS — O368190 Decreased fetal movements, unspecified trimester, not applicable or unspecified: Secondary | ICD-10-CM

## 2020-05-02 DIAGNOSIS — Z87891 Personal history of nicotine dependence: Secondary | ICD-10-CM | POA: Insufficient documentation

## 2020-05-02 DIAGNOSIS — B3731 Acute candidiasis of vulva and vagina: Secondary | ICD-10-CM

## 2020-05-02 DIAGNOSIS — O98813 Other maternal infectious and parasitic diseases complicating pregnancy, third trimester: Secondary | ICD-10-CM | POA: Diagnosis not present

## 2020-05-02 DIAGNOSIS — B373 Candidiasis of vulva and vagina: Secondary | ICD-10-CM | POA: Diagnosis not present

## 2020-05-02 DIAGNOSIS — Z3689 Encounter for other specified antenatal screening: Secondary | ICD-10-CM

## 2020-05-02 LAB — WET PREP, GENITAL
Clue Cells Wet Prep HPF POC: NONE SEEN
Sperm: NONE SEEN
Trich, Wet Prep: NONE SEEN
Yeast Wet Prep HPF POC: NONE SEEN

## 2020-05-02 MED ORDER — TERCONAZOLE 0.4 % VA CREA
1.0000 | TOPICAL_CREAM | Freq: Every day | VAGINAL | 0 refills | Status: DC
Start: 2020-05-02 — End: 2020-05-14

## 2020-05-02 NOTE — MAU Note (Signed)
Pt states she has not felt any movement since yesterday at 1900. Tried eating and moving stomach, stayed up until around 0200-0300 trying to feel movement but never did. Reports a "yellowish" discharge, itching and vaginal irritation. Was treated for a yeast infection last week with diflucan. Felt relief for a few days but felt worse once the discharge changed to a yellow mucus. States she started getting numbness in her arms hands and fingers a month and it has gotten worse "to the point where I am dropping things". Denies LOF. Reports bright red spotting but states she thinks it is from the outside being irritated.

## 2020-05-02 NOTE — MAU Provider Note (Signed)
History     CSN: [id number]  Arrival date and time: 05/02/20 1407   First Provider Initiated Contact with Patient 05/02/20 1439      Chief Complaint  Patient presents with  . Decreased Fetal Movement  . Vaginal Discharge  . numbness in arms, hands and fingers bilaterally   19 y.o. G1 _0 .5 wks presenting with decreased FM and vaginal discharge. Reports no FM since 1900 last night. She tried eating but has a low appetite and could only eat some cereal and juice today. Denies ctx, VB, or LOF. Was treated recently for yeast and now has a yellow discharge. Feels very itchy and irritated and saw some pink from the side of her vulva when she wiped. No recent sex.    OB History    Gravida  1   Para      Term      Preterm      AB      Living        SAB      TAB      Ectopic      Multiple      Live Births              Past Medical History:  Diagnosis Date  . Allergy    seasonal  . Constipation   . Headache(784.0)    migraines    Past Surgical History:  Procedure Laterality Date  . FOREIGN BODY REMOVAL  04/18/2012   Procedure: FOREIGN BODY REMOVAL PEDIATRIC;  Surgeon: Alta Corning, MD;  Location: [facility];  Service: Orthopedics;  Laterality: Left;  left foot 3rd toe  . TONSILLECTOMY     last  year  . TONSILLECTOMY AND ADENOIDECTOMY Bilateral 2012    Family History  Problem Relation Age of Onset  . Arthritis Maternal Grandmother   . Depression Maternal Grandmother   . Diabetes Maternal Grandmother   . Hyperlipidemia Maternal Grandmother   . Seizures Sister     Social History   Tobacco Use  . Smoking status: Former Smoker    Types: Cigarettes  . Smokeless tobacco: Never Used  Vaping Use  . Vaping Use: Former  Substance Use Topics  . Alcohol use: Not Currently    Alcohol/week: 0.0 standard drinks  . Drug use: Not Currently    Frequency: 2.0 times per week    Types: Marijuana    Allergies:  Allergies  Allergen Reactions   . Other Itching and Cough    Seasonal allergies    Medications Prior to Admission  Medication Sig Dispense Refill Last Dose  . prenatal vitamin w/FE, FA (PRENATAL 1 + 1) 27-1 MG TABS tablet Take 1 tablet by mouth daily at 12 noon. 30 tablet 6 05/01/2020 at Unknown time  . Blood Pressure KIT 1 Units by Does not apply route once a week. 1 kit 0     Review of Systems  Gastrointestinal: Negative for abdominal pain.  Genitourinary: Positive for vaginal discharge. Negative for vaginal bleeding.   Physical Exam   Blood pressure 130/86, pulse (!) 121, temperature 98 F (36.7 C), resp. rate 17, last menstrual period 08/14/2019, SpO2 100 %.  Physical Exam  Nursing note and vitals reviewed. Constitutional: She is oriented to person, place, and time. No distress.  HENT:  Head: Normocephalic.  Respiratory: Effort normal. No respiratory distress.  GI: Soft. There is no abdominal tenderness.  gravid  Genitourinary:    Genitourinary Comments: External: no lesions or moderate erythema Vagina: rugated,  pink, moist, thick yellow discharge, no blood Cervix closed/long    Musculoskeletal:        General: Normal range of motion.     Cervical back: Normal range of motion.  Neurological: She is alert and oriented to person, place, and time.  Skin: Skin is warm and dry.  Psychiatric: Mood normal.  EFM: 145 bpm, mod variability, + accels, no decels Toco: rare  Results for orders placed or performed during the hospital encounter of 05/02/20 (from the past 24 hour(s))  Wet prep, genital     Status: Abnormal   Collection Time: 05/02/20  2:51 PM  Result Value Ref Range   Yeast Wet Prep HPF POC NONE SEEN NONE SEEN   Trich, Wet Prep NONE SEEN NONE SEEN   Clue Cells Wet Prep HPF POC NONE SEEN NONE SEEN   WBC, Wet Prep HPF POC MANY (A) NONE SEEN   Sperm NONE SEEN    MAU Course  Procedures  MDM Labs and BPP ordered and reviewed. BPP 0/3>52/48, AFI 9.9, cephalic. Pt reports not feeling FM.  Consult with Dr. Roselie Awkward, fetal surveilence reassuring, ok for discharge home. Discussed with pt specifically how to do [facility]. Recommend regular meals and snacks and good hydration. Will treat for yeast.  Assessment and Plan   1. [redacted] weeks gestation of pregnancy   2. Encounter for supervision of low-risk pregnancy in second trimester   3. Decreased fetal movement   4. NST (non-stress test) reactive   5. Yeast vaginitis    Discharge home Follow up at [facility] for [place]- message sent to schedule pt Rx Terazol [facility]  Allergies as of 05/02/2020      Reactions   Other Itching, Cough   Seasonal allergies      Medication List    TAKE these medications   Blood Pressure Kit 1 Units by Does not apply route once a week.   prenatal vitamin w/FE, FA 27-1 MG Tabs tablet Take 1 tablet by mouth daily at 12 noon.   terconazole 0.4 % vaginal cream Commonly known as: TERAZOL 7 Place 1 applicator vaginally at bedtime.      Julianne Handler, CNM 05/02/2020, 4:40 PM

## 2020-05-02 NOTE — Discharge Instructions (Signed)
Fetal Movement Counts Patient Name: ________________________________________________ Patient Due Date: ____________________ What is a fetal movement count?  A fetal movement count is the number of times that you feel your baby move during a certain amount of time. This may also be called a fetal kick count. A fetal movement count is recommended for every pregnant woman. You may be asked to start counting fetal movements as early as week 28 of your pregnancy. Pay attention to when your baby is most active. You may notice your baby's sleep and wake cycles. You may also notice things that make your baby move more. You should do a fetal movement count:  When your baby is normally most active.  At the same time each day. A good time to count movements is while you are resting, after having something to eat and drink. How do I count fetal movements? 1. Find a quiet, comfortable area. Sit, or lie down on your side. 2. Write down the date, the start time and stop time, and the number of movements that you felt between those two times. Take this information with you to your health care visits. 3. Write down your start time when you feel the first movement. 4. Count kicks, flutters, swishes, rolls, and jabs. You should feel at least 10 movements. 5. You may stop counting after you have felt 10 movements, or if you have been counting for 2 hours. Write down the stop time. 6. If you do not feel 10 movements in 2 hours, contact your health care provider for further instructions. Your health care provider may want to do additional tests to assess your baby's well-being. Contact a health care provider if:  You feel fewer than 10 movements in 2 hours.  Your baby is not moving like he or she usually does. Date: ____________ Start time: ____________ Stop time: ____________ Movements: ____________ Date: ____________ Start time: ____________ Stop time: ____________ Movements: ____________ Date: ____________  Start time: ____________ Stop time: ____________ Movements: ____________ Date: ____________ Start time: ____________ Stop time: ____________ Movements: ____________ Date: ____________ Start time: ____________ Stop time: ____________ Movements: ____________ Date: ____________ Start time: ____________ Stop time: ____________ Movements: ____________ Date: ____________ Start time: ____________ Stop time: ____________ Movements: ____________ Date: ____________ Start time: ____________ Stop time: ____________ Movements: ____________ Date: ____________ Start time: ____________ Stop time: ____________ Movements: ____________ This information is not intended to replace advice given to you by your health care provider. Make sure you discuss any questions you have with your health care provider. Document Revised: 06/25/2019 Document Reviewed: 06/25/2019 Elsevier Patient Education  2020 Elsevier Inc.  

## 2020-05-03 LAB — GC/CHLAMYDIA PROBE AMP (~~LOC~~) NOT AT ARMC
Chlamydia: NEGATIVE
Comment: NEGATIVE
Comment: NORMAL
Neisseria Gonorrhea: NEGATIVE

## 2020-05-08 ENCOUNTER — Inpatient Hospital Stay (HOSPITAL_COMMUNITY)
Admission: AD | Admit: 2020-05-08 | Discharge: 2020-05-08 | Disposition: A | Discharge status: Home / Self Care | Payer: Medicaid Other | Attending: Obstetrics and Gynecology | Admitting: Obstetrics and Gynecology

## 2020-05-08 ENCOUNTER — Encounter (HOSPITAL_COMMUNITY): Payer: Self-pay | Admitting: Obstetrics and Gynecology

## 2020-05-08 ENCOUNTER — Other Ambulatory Visit: Payer: Self-pay

## 2020-05-08 DIAGNOSIS — Z3A36 36 weeks gestation of pregnancy: Secondary | ICD-10-CM | POA: Diagnosis not present

## 2020-05-08 DIAGNOSIS — O4703 False labor before 37 completed weeks of gestation, third trimester: Secondary | ICD-10-CM | POA: Diagnosis present

## 2020-05-08 DIAGNOSIS — O479 False labor, unspecified: Secondary | ICD-10-CM

## 2020-05-08 NOTE — MAU Provider Note (Signed)
None   S: Ms. Rhonda Sparks is a 19 y.o. G1P0 at [redacted]w[redacted]d  who presents to MAU today complaining contractions irregularly. She endorses vaginal bleeding. She denies LOF. She reports normal fetal movement.  States she called EMS after see blood when she went to the bathroom. Patient currently on house arrest.   O: LMP 08/14/2019 (Exact Date)  GENERAL: Well-developed, well-nourished female in no acute distress.  HEAD: Normocephalic, atraumatic.  CHEST: Normal effort of breathing, regular heart rate ABDOMEN: Soft, nontender, gravid  Cervical exam:  Dilation: Closed Exam by:: Shravan Salahuddin np  No blood or brown discharge noted on exam glove.    Fetal Monitoring: Baseline: 135 bpm  Variability: Moderate  Accelerations: 15x15 Decelerations: None Contractions: Quiet    A: SIUP at [redacted]w[redacted]d  False labor  No bleeding noted on exam   P: Dc home in stable condition Labor precautions Return to MAU if symptoms worsen Keep OB appointment in office.   Duane Lope, NP 05/08/2020 3:48 AM

## 2020-05-08 NOTE — Discharge Instructions (Signed)
Braxton Hicks Contractions Contractions of the uterus can occur throughout pregnancy, but they are not always a sign that you are in labor. You may have practice contractions called Braxton Hicks contractions. These false labor contractions are sometimes confused with true labor. What are Braxton Hicks contractions? Braxton Hicks contractions are tightening movements that occur in the muscles of the uterus before labor. Unlike true labor contractions, these contractions do not result in opening (dilation) and thinning of the cervix. Toward the end of pregnancy (32-34 weeks), Braxton Hicks contractions can happen more often and may become stronger. These contractions are sometimes difficult to tell apart from true labor because they can be very uncomfortable. You should not feel embarrassed if you go to the hospital with false labor. Sometimes, the only way to tell if you are in true labor is for your health care provider to look for changes in the cervix. The health care provider will do a physical exam and may monitor your contractions. If you are not in true labor, the exam should show that your cervix is not dilating and your water has not broken. If there are no other health problems associated with your pregnancy, it is completely safe for you to be sent home with false labor. You may continue to have Braxton Hicks contractions until you go into true labor. How to tell the difference between true labor and false labor True labor  Contractions last 30-70 seconds.  Contractions become very regular.  Discomfort is usually felt in the top of the uterus, and it spreads to the lower abdomen and low back.  Contractions do not go away with walking.  Contractions usually become more intense and increase in frequency.  The cervix dilates and gets thinner. False labor  Contractions are usually shorter and not as strong as true labor contractions.  Contractions are usually irregular.  Contractions  are often felt in the front of the lower abdomen and in the groin.  Contractions may go away when you walk around or change positions while lying down.  Contractions get weaker and are shorter-lasting as time goes on.  The cervix usually does not dilate or become thin. Follow these instructions at home:   Take over-the-counter and prescription medicines only as told by your health care provider.  Keep up with your usual exercises and follow other instructions from your health care provider.  Eat and drink lightly if you think you are going into labor.  If Braxton Hicks contractions are making you uncomfortable: ? Change your position from lying down or resting to walking, or change from walking to resting. ? Sit and rest in a tub of warm water. ? Drink enough fluid to keep your urine pale yellow. Dehydration may cause these contractions. ? Do slow and deep breathing several times an hour.  Keep all follow-up prenatal visits as told by your health care provider. This is important. Contact a health care provider if:  You have a fever.  You have continuous pain in your abdomen. Get help right away if:  Your contractions become stronger, more regular, and closer together.  You have fluid leaking or gushing from your vagina.  You pass blood-tinged mucus (bloody show).  You have bleeding from your vagina.  You have low back pain that you never had before.  You feel your baby's head pushing down and causing pelvic pressure.  Your baby is not moving inside you as much as it used to. Summary  Contractions that occur before labor are   called Braxton Hicks contractions, false labor, or practice contractions.  Braxton Hicks contractions are usually shorter, weaker, farther apart, and less regular than true labor contractions. True labor contractions usually become progressively stronger and regular, and they become more frequent.  Manage discomfort from Braxton Hicks contractions  by changing position, resting in a warm bath, drinking plenty of water, or practicing deep breathing. This information is not intended to replace advice given to you by your health care provider. Make sure you discuss any questions you have with your health care provider. Document Revised: 10/18/2017 Document Reviewed: 03/21/2017 Elsevier Patient Education  2020 [employer] Inc[employer] Fetal Movement Counts Patient Name: ________________________________________________ Patient Due Date: ____________________ What is a fetal movement count?  A fetal movement count is the number of times that you feel your baby move during a certain amount of time. This may also be called a fetal kick count. A fetal movement count is recommended for every pregnant woman. You may be asked to start counting fetal movements as early as week 28 of your pregnancy. Pay attention to when your baby is most active. You may notice your baby's sleep and wake cycles. You may also notice things that make your baby move more. You should do a fetal movement count:  When your baby is normally most active.  At the same time each day. A good time to count movements is while you are resting, after having something to eat and drink. How do I count fetal movements? 1. Find a quiet, comfortable area. Sit, or lie down on your side. 2. Write down the date, the start time and stop time, and the number of movements that you felt between those two times. Take this information with you to your health care visits. 3. Write down your start time when you feel the first movement. 4. Count kicks, flutters, swishes, rolls, and jabs. You should feel at least 10 movements. 5. You may stop counting after you have felt 10 movements, or if you have been counting for 2 hours. Write down the stop time. 6. If you do not feel 10 movements in 2 hours, contact your health care provider for further instructions. Your health care provider may want to do additional tests  to assess your baby's well-being. Contact a health care provider if:  You feel fewer than 10 movements in 2 hours.  Your baby is not moving like he or she usually does. Date: ____________ Start time: ____________ Stop time: ____________ Movements: ____________ Date: ____________ Start time: ____________ Stop time: ____________ Movements: ____________ Date: ____________ Start time: ____________ Stop time: ____________ Movements: ____________ Date: ____________ Start time: ____________ Stop time: ____________ Movements: ____________ Date: ____________ Start time: ____________ Stop time: ____________ Movements: ____________ Date: ____________ Start time: ____________ Stop time: ____________ Movements: ____________ Date: ____________ Start time: ____________ Stop time: ____________ Movements: ____________ Date: ____________ Start time: ____________ Stop time: ____________ Movements: ____________ Date: ____________ Start time: ____________ Stop time: ____________ Movements: ____________ This information is not intended to replace advice given to you by your health care provider. Make sure you discuss any questions you have with your health care provider. Document Revised: 06/25/2019 Document Reviewed: 06/25/2019 Elsevier Patient Education  2020 [employer] Inc.  

## 2020-05-08 NOTE — MAU Note (Signed)
Pt reports to MAU c/o vaginal bleeding that started around 0300 pt states she noticed it when she went to the [facility]. Pt states it was bright red that eventually lightened up. Pt denies having wearing to wear a pad because she came here. Pt denies abdominal pain. Pt unsure of FM.

## 2020-05-09 ENCOUNTER — Ambulatory Visit (INDEPENDENT_AMBULATORY_CARE_PROVIDER_SITE_OTHER): Payer: Medicaid Other | Admitting: Women's Health

## 2020-05-09 VITALS — BP 124/77 | HR 117 | Temp 98.6°F | Wt 203.0 lb

## 2020-05-09 DIAGNOSIS — Z3493 Encounter for supervision of normal pregnancy, unspecified, third trimester: Secondary | ICD-10-CM

## 2020-05-09 DIAGNOSIS — Z23 Encounter for immunization: Secondary | ICD-10-CM | POA: Diagnosis not present

## 2020-05-09 DIAGNOSIS — Z3A36 36 weeks gestation of pregnancy: Secondary | ICD-10-CM

## 2020-05-09 DIAGNOSIS — O3663X0 Maternal care for excessive fetal growth, third trimester, not applicable or unspecified: Secondary | ICD-10-CM

## 2020-05-09 DIAGNOSIS — O99820 Streptococcus B carrier state complicating pregnancy: Secondary | ICD-10-CM

## 2020-05-09 NOTE — Addendum Note (Signed)
Addended by: Gerome Apley on: 05/09/2020 03:58 PM   Modules accepted: Orders

## 2020-05-09 NOTE — Patient Instructions (Addendum)
Maternity Assessment Unit (MAU)  The Maternity Assessment Unit (MAU) is located at the [facility] at [facility]. The address is: [address] [place], [place], [place] [address]. Please see map below for additional directions.    The Maternity Assessment Unit is designed to help you during your pregnancy, and for up to 6 weeks after delivery, with any pregnancy- or postpartum-related emergencies, if you think you are in labor, or if your water has broken. For example, if you experience nausea and vomiting, vaginal bleeding, severe abdominal or pelvic pain, elevated blood pressure or other problems related to your pregnancy or postpartum time, please come to the Maternity Assessment Unit for assistance.      Fetal Movement Counts Patient Name: ________________________________________________ Patient Due Date: ____________________ What is a fetal movement count?  A fetal movement count is the number of times that you feel your baby move during a certain amount of time. This may also be called a fetal kick count. A fetal movement count is recommended for every pregnant woman. You may be asked to start counting fetal movements as early as week 28 of your pregnancy. Pay attention to when your baby is most active. You may notice your baby's sleep and wake cycles. You may also notice things that make your baby move more. You should do a fetal movement count:  When your baby is normally most active.  At the same time each day. A good time to count movements is while you are resting, after having something to eat and drink. How do I count fetal movements? 1. Find a quiet, comfortable area. Sit, or lie down on your side. 2. Write down the date, the start time and stop time, and the number of movements that you felt between those two times. Take this information with you to your health care visits. 3. Write down your start time when you feel the first  movement. 4. Count kicks, flutters, swishes, rolls, and jabs. You should feel at least 10 movements. 5. You may stop counting after you have felt 10 movements, or if you have been counting for 2 hours. Write down the stop time. 6. If you do not feel 10 movements in 2 hours, contact your health care provider for further instructions. Your health care provider may want to do additional tests to assess your baby's well-being. Contact a health care provider if:  You feel fewer than 10 movements in 2 hours.  Your baby is not moving like he or she usually does. Date: ____________ Start time: ____________ Stop time: ____________ Movements: ____________ Date: ____________ Start time: ____________ Stop time: ____________ Movements: ____________ Date: ____________ Start time: ____________ Stop time: ____________ Movements: ____________ Date: ____________ Start time: ____________ Stop time: ____________ Movements: ____________ Date: ____________ Start time: ____________ Stop time: ____________ Movements: ____________ Date: ____________ Start time: ____________ Stop time: ____________ Movements: ____________ Date: ____________ Start time: ____________ Stop time: ____________ Movements: ____________ Date: ____________ Start time: ____________ Stop time: ____________ Movements: ____________ Date: ____________ Start time: ____________ Stop time: ____________ Movements: ____________ This information is not intended to replace advice given to you by your health care provider. Make sure you discuss any questions you have with your health care provider. Document Revised: 06/25/2019 Document Reviewed: 06/25/2019 Elsevier Patient Education  2020 [employer].        Signs and Symptoms of Labor Labor is your body's natural process of moving your baby, placenta, and umbilical cord out of your uterus. The process of  labor usually starts when your baby is full-term, between 46 and 40 weeks of pregnancy. How  will I know when I am close to going into labor? As your body prepares for labor and the birth of your baby, you may notice the following symptoms in the weeks and days before true labor starts:  Having a strong desire to get your home ready to receive your new baby. This is called nesting. Nesting may be a sign that labor is approaching, and it may occur several weeks before birth. Nesting may involve cleaning and organizing your home.  Passing a small amount of thick, bloody mucus out of your vagina (normal bloody show or losing your mucus plug). This may happen more than a week before labor begins, or it might occur right before labor begins as the opening of the cervix starts to widen (dilate). For some women, the entire mucus plug passes at once. For others, smaller portions of the mucus plug may gradually pass over several days.  Your baby moving (dropping) lower in your pelvis to get into position for birth (lightening). When this happens, you may feel more pressure on your bladder and pelvic bone and less pressure on your ribs. This may make it easier to breathe. It may also cause you to need to urinate more often and have problems with bowel movements.  Having "practice contractions" (Braxton Hicks contractions) that occur at irregular (unevenly spaced) intervals that are more than 10 minutes apart. This is also called false labor. False labor contractions are common after exercise or sexual activity, and they will stop if you change position, rest, or drink fluids. These contractions are usually mild and do not get stronger over time. They may feel like: ? A backache or back pain. ? Mild cramps, similar to menstrual cramps. ? Tightening or pressure in your abdomen. Other early symptoms that labor may be starting soon include:  Nausea or loss of appetite.  Diarrhea.  Having a sudden burst of energy, or feeling very tired.  Mood changes.  Having trouble sleeping. How will I know when  labor has begun? Signs that true labor has begun may include:  Having contractions that come at regular (evenly spaced) intervals and increase in intensity. This may feel like more intense tightening or pressure in your abdomen that moves to your back. ? Contractions may also feel like rhythmic pain in your upper thighs or back that comes and goes at regular intervals. ? For first-time mothers, this change in intensity of contractions often occurs at a more gradual pace. ? Women who have given birth before may notice a more rapid progression of contraction changes.  Having a feeling of pressure in the vaginal area.  Your water breaking (rupture of membranes). This is when the sac of fluid that surrounds your baby breaks. When this happens, you will notice fluid leaking from your vagina. This may be clear or blood-tinged. Labor usually starts within 24 hours of your water breaking, but it may take longer to begin. ? Some women notice this as a gush of fluid. ? Others notice that their underwear repeatedly becomes damp. Follow these instructions at home:   When labor starts, or if your water breaks, call your health care provider or nurse care line. Based on your situation, they will determine when you should go in for an exam.  When you are in early labor, you may be able to rest and manage symptoms at home. Some strategies to try at home  include: ? Breathing and relaxation techniques. ? Taking a warm bath or shower. ? Listening to music. ? Using a heating pad on the lower back for pain. If you are directed to use heat:  Place a towel between your skin and the heat source.  Leave the heat on for 20-30 minutes.  Remove the heat if your skin turns bright red. This is especially important if you are unable to feel pain, heat, or cold. You may have a greater risk of getting burned. Get help right away if:  You have painful, regular contractions that are 5 minutes apart or less.  Labor  starts before you are [redacted] weeks along in your pregnancy.  You have a fever.  You have a headache that does not go away.  You have bright red blood coming from your vagina.  You do not feel your baby moving.  You have a sudden onset of: ? Severe headache with vision problems. ? Nausea, vomiting, or diarrhea. ? Chest pain or shortness of breath. These symptoms may be an emergency. If your health care provider recommends that you go to the hospital or birth center where you plan to deliver, do not drive yourself. Have someone else drive you, or call emergency services (911 in the [place] Summary  Labor is your body's natural process of moving your baby, placenta, and umbilical cord out of your uterus.  The process of labor usually starts when your baby is full-term, between 36 and 40 weeks of pregnancy.  When labor starts, or if your water breaks, call your health care provider or nurse care line. Based on your situation, they will determine when you should go in for an exam. This information is not intended to replace advice given to you by your health care provider. Make sure you discuss any questions you have with your health care provider. Document Revised: 08/05/2017 Document Reviewed: 04/12/2017 Elsevier Patient Education  [place].      https://www.cdc.gov/vaccines/hcp/vis/vis-statements/tdap.pdf">  Tdap (Tetanus, Diphtheria, Pertussis) Vaccine: What You Need to Know 1. Why get vaccinated? Tdap vaccine can prevent tetanus, diphtheria, and pertussis. Diphtheria and pertussis spread from person to person. Tetanus enters the body through cuts or wounds.  TETANUS (T) causes painful stiffening of the muscles. Tetanus can lead to serious health problems, including being unable to open the mouth, having trouble swallowing and breathing, or death.  DIPHTHERIA (D) can lead to difficulty breathing, heart failure, paralysis, or death.  PERTUSSIS (aP), also known as "whooping  cough," can cause uncontrollable, violent coughing which makes it hard to breathe, eat, or drink. Pertussis can be extremely serious in babies and young children, causing pneumonia, convulsions, brain damage, or death. In teens and adults, it can cause weight loss, loss of bladder control, passing out, and rib fractures from severe coughing. 2. Tdap vaccine Tdap is only for children 7 years and older, adolescents, and adults.  Adolescents should receive a single dose of Tdap, preferably at age 1 or 69 years. Pregnant women should get a dose of Tdap during every pregnancy, to protect the newborn from pertussis. Infants are most at risk for severe, life-threatening complications from pertussis. Adults who have never received Tdap should get a dose of Tdap. Also, adults should receive a booster dose every 10 years, or earlier in the case of a severe and dirty wound or burn. Booster doses can be either Tdap or Td (a different vaccine that protects against tetanus and diphtheria but not pertussis). Tdap may be given  at the same time as other vaccines. 3. Talk with your health care provider Tell your vaccine provider if the person getting the vaccine:  Has had an allergic reaction after a previous dose of any vaccine that protects against tetanus, diphtheria, or pertussis, or has any severe, life-threatening allergies.  Has had a coma, decreased level of consciousness, or prolonged seizures within 7 days after a previous dose of any pertussis vaccine (DTP, DTaP, or Tdap).  Has seizures or another nervous system problem.  Has ever had Guillain-Barr Syndrome (also called GBS).  Has had severe pain or swelling after a previous dose of any vaccine that protects against tetanus or diphtheria. In some cases, your health care provider may decide to postpone Tdap vaccination to a future visit.  People with minor illnesses, such as a cold, may be vaccinated. People who are moderately or severely ill should  usually wait until they recover before getting Tdap vaccine.  Your health care provider can give you more information. 4. Risks of a vaccine reaction  Pain, redness, or swelling where the shot was given, mild fever, headache, feeling tired, and nausea, vomiting, diarrhea, or stomachache sometimes happen after Tdap vaccine. People sometimes faint after medical procedures, including vaccination. Tell your provider if you feel dizzy or have vision changes or ringing in the ears.  As with any medicine, there is a very remote chance of a vaccine causing a severe allergic reaction, other serious injury, or death. 5. What if there is a serious problem? An allergic reaction could occur after the vaccinated person leaves the clinic. If you see signs of a severe allergic reaction (hives, swelling of the face and throat, difficulty breathing, a fast heartbeat, dizziness, or weakness), call 9-1-1 and get the person to the nearest hospital. For other signs that concern you, call your health care provider.  Adverse reactions should be reported to the Vaccine Adverse Event Reporting System (VAERS). Your health care provider will usually file this report, or you can do it yourself. Visit the VAERS website at www.vaers.LAgents.no or call 657-489-5920. VAERS is only for reporting reactions, and VAERS staff do not give medical advice. 6. The National Vaccine Injury Compensation Program The [employer] Vaccine Injury Compensation Program (VICP) is a federal program that was created to compensate people who may have been injured by certain vaccines. Visit the VICP website at SpiritualWord.at or call 608-415-8057 to learn about the program and about filing a claim. There is a time limit to file a claim for compensation. 7. How can I learn more?  Ask your health care provider.  Call your local or state health department.  Contact the Centers for Disease Control and Prevention (CDC): ? Call 8648414545  (1-800-CDC-INFO) or ? Visit CDC's website at PicCapture.uy Vaccine Information Statement Tdap (Tetanus, Diphtheria, Pertussis) Vaccine (02/18/2019) This information is not intended to replace advice given to you by your health care provider. Make sure you discuss any questions you have with your health care provider. Document Revised: 02/27/2019 Document Reviewed: 03/02/2019 Elsevier Patient Education  2020 Elsevier Inc.     Carpal Tunnel Syndrome  Carpal tunnel syndrome is a condition that causes pain in your hand and arm. The carpal tunnel is a narrow area located on the palm side of your wrist. Repeated wrist motion or certain diseases may cause swelling within the tunnel. This swelling pinches the main nerve in the wrist (median nerve). What are the causes? This condition may be caused by:  Repeated wrist motions.  Wrist injuries.  Arthritis.  A cyst or tumor in the carpal tunnel.  Fluid buildup during pregnancy. Sometimes the cause of this condition is not known. What increases the risk? The following factors may make you more likely to develop this condition:  Having a job, such as being a Haematologist, that requires you to repeatedly move your wrist in the same motion.  Being a woman.  Having certain conditions, such as: ? Diabetes. ? Obesity. ? An underactive thyroid (hypothyroidism). ? Kidney failure. What are the signs or symptoms? Symptoms of this condition include:  A tingling feeling in your fingers, especially in your thumb, index, and middle fingers.  Tingling or numbness in your hand.  An aching feeling in your entire arm, especially when your wrist and elbow are bent for a long time.  Wrist pain that goes up your arm to your shoulder.  Pain that goes down into your palm or fingers.  A weak feeling in your hands. You may have trouble grabbing and holding items. Your symptoms may feel worse during the night. How is this  diagnosed? This condition is diagnosed with a medical history and physical exam. You may also have tests, including:  Electromyogram (EMG). This test measures electrical signals sent by your nerves into the muscles.  Nerve conduction study. This test measures how well electrical signals pass through your nerves.  Imaging tests, such as X-rays, ultrasound, and MRI. These tests check for possible causes of your condition. How is this treated? This condition may be treated with:  Lifestyle changes. It is important to stop or change the activity that caused your condition.  Doing exercise and activities to strengthen your muscles and bones (physical therapy).  Learning how to use your hand again after diagnosis (occupational therapy).  Medicines for pain and inflammation. This may include medicine that is injected into your wrist.  A wrist splint  Surgery. Follow these instructions at home: If you have a splint:  Wear the splint as told by your health care provider. Remove it only as told by your health care provider.  Loosen the splint if your fingers tingle, become numb, or turn cold and blue.  Keep the splint clean.  If the splint is not waterproof: ? Do not let it get wet. ? Cover it with a watertight covering when you take a bath or shower. Managing pain, stiffness, and swelling   If directed, put ice on the painful area: ? If you have a removable splint, remove it as told by your health care provider. ? Put ice in a plastic bag. ? Place a towel between your skin and the bag. ? Leave the ice on for 20 minutes, 2-3 times per day. General instructions  Take over-the-counter and prescription medicines only as told by your health care provider.  Rest your wrist from any activity that may be causing your pain. If your condition is work related, talk with your employer about changes that can be made, such as getting a wrist pad to use while typing.  Do any exercises as told  by your health care provider, physical therapist, or occupational therapist.  Keep all follow-up visits as told by your health care provider. This is important. Contact a health care provider if:  You have new symptoms.  Your pain is not controlled with medicines.  Your symptoms get worse. Get help right away if:  You have severe numbness or tingling in your wrist or hand. Summary  Carpal tunnel syndrome is a  condition that causes pain in your hand and arm.  It is usually caused by repeated wrist motions.  Lifestyle changes and medicines are used to treat carpal tunnel syndrome. Surgery may be recommended.  Follow your health care provider's instructions about wearing a splint, resting from activity, keeping follow-up visits, and calling for help. This information is not intended to replace advice given to you by your health care provider. Make sure you discuss any questions you have with your health care provider. Document Revised: 03/14/2018 Document Reviewed: 03/14/2018 Elsevier Patient Education  2020 Elsevier Inc.        Group B Streptococcus Infection During Pregnancy Group B Streptococcus (GBS) is a type of bacteria that is often found in healthy people. It is commonly found in the rectum, vagina, and intestines. In people who are healthy and not pregnant, the bacteria rarely cause serious illness or complications. However, women who test positive for GBS during pregnancy can pass the bacteria to the baby during childbirth. This can cause serious infection in the baby after birth. Women with GBS may also have infections during their pregnancy or soon after childbirth. The infections include urinary tract infections (UTIs) or infections of the uterus. GBS also increases a woman's risk of complications during pregnancy, such as early labor or delivery, miscarriage, or stillbirth. Routine testing for GBS is recommended for all pregnant women. What are the causes? This  condition is caused by bacteria called Streptococcus agalactiae. What increases the risk? You may have a higher risk for GBS infection during pregnancy if you had one during a past pregnancy. What are the signs or symptoms? In most cases, GBS infection does not cause symptoms in pregnant women. If symptoms exist, they may include:  Labor that starts before the 37th week of pregnancy.  A UTI or bladder infection. This may cause a fever, frequent urination, or pain and burning during urination.  Fever during labor. There can also be a rapid heartbeat in the mother or baby. Rare but serious symptoms of a GBS infection in women include:  Blood infection (septicemia). This may cause fever, chills, or confusion.  Lung infection (pneumonia). This may cause fever, chills, cough, rapid breathing, chest pain, or difficulty breathing.  Bone, joint, skin, or soft tissue infection. How is this diagnosed? You may be screened for GBS between week 35 and week 37 of pregnancy. If you have symptoms of preterm labor, you may be screened earlier. This condition is diagnosed based on lab test results from:  A swab of fluid from the vagina and rectum.  A urine sample. How is this treated? This condition is treated with antibiotic medicine. Antibiotic medicine may be given:  To you when you go into labor, or as soon as your water breaks. The medicines will continue until after you give birth. If you are having a cesarean delivery, you do not need antibiotics unless your water has broken.  To your baby, if he or she requires treatment. Your health care provider will check your baby to decide if he or she needs antibiotics to prevent a serious infection. Follow these instructions at home:  Take over-the-counter and prescription medicines only as told by your health care provider.  Take your antibiotic medicine as told by your health care provider. Do not stop taking the antibiotic even if you start to feel  better.  Keep all pre-birth (prenatal) visits and follow-up visits as told by your health care provider. This is important. Contact a health care provider if:  You have pain or burning when you urinate.  You have to urinate more often than usual.  You have a fever or chills.  You develop a bad-smelling vaginal discharge. Get help right away if:  Your water breaks.  You go into labor.  You have severe pain in your abdomen.  You have difficulty breathing.  You have chest pain. These symptoms may represent a serious problem that is an emergency. Do not wait to see if the symptoms will go away. Get medical help right away. Call your local emergency services (911 in the [place] Do not drive yourself to the hospital. Summary  GBS is a type of bacteria that is common in healthy people.  During pregnancy, colonization with GBS can cause serious complications for you or your baby.  Your health care provider will screen you between 35 and 37 weeks of pregnancy to determine if you are colonized with GBS.  If you are colonized with GBS during pregnancy, your health care provider will recommend antibiotics through an IV during labor.  After delivery, your baby will be evaluated for complications related to potential GBS infection and may require antibiotics to prevent a serious infection. This information is not intended to replace advice given to you by your health care provider. Make sure you discuss any questions you have with your health care provider. Document Revised: 06/01/2019 Document Reviewed: 06/01/2019 Elsevier Patient Education  2020 [employer]
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
## 2020-05-11 ENCOUNTER — Encounter (HOSPITAL_COMMUNITY): Payer: Self-pay | Admitting: Obstetrics and Gynecology

## 2020-05-11 ENCOUNTER — Inpatient Hospital Stay (HOSPITAL_COMMUNITY)
Admission: AD | Admit: 2020-05-11 | Discharge: 2020-05-14 | DRG: 807 | Disposition: A | Discharge status: Home / Self Care | Payer: Medicaid Other | Attending: Obstetrics and Gynecology | Admitting: Obstetrics and Gynecology

## 2020-05-11 ENCOUNTER — Other Ambulatory Visit: Payer: Self-pay

## 2020-05-11 DIAGNOSIS — Z20822 Contact with and (suspected) exposure to covid-19: Secondary | ICD-10-CM | POA: Diagnosis present

## 2020-05-11 DIAGNOSIS — Z3A37 37 weeks gestation of pregnancy: Secondary | ICD-10-CM

## 2020-05-11 DIAGNOSIS — Z87891 Personal history of nicotine dependence: Secondary | ICD-10-CM

## 2020-05-11 DIAGNOSIS — O3663X0 Maternal care for excessive fetal growth, third trimester, not applicable or unspecified: Secondary | ICD-10-CM | POA: Diagnosis present

## 2020-05-11 DIAGNOSIS — O99824 Streptococcus B carrier state complicating childbirth: Secondary | ICD-10-CM | POA: Diagnosis present

## 2020-05-11 DIAGNOSIS — O4292 Full-term premature rupture of membranes, unspecified as to length of time between rupture and onset of labor: Principal | ICD-10-CM | POA: Diagnosis present

## 2020-05-11 DIAGNOSIS — M4300 Spondylolysis, site unspecified: Secondary | ICD-10-CM | POA: Diagnosis present

## 2020-05-11 DIAGNOSIS — O1404 Mild to moderate pre-eclampsia, complicating childbirth: Secondary | ICD-10-CM | POA: Diagnosis present

## 2020-05-11 DIAGNOSIS — Z3493 Encounter for supervision of normal pregnancy, unspecified, third trimester: Secondary | ICD-10-CM

## 2020-05-11 DIAGNOSIS — G4452 New daily persistent headache (NDPH): Secondary | ICD-10-CM | POA: Diagnosis present

## 2020-05-11 DIAGNOSIS — O99820 Streptococcus B carrier state complicating pregnancy: Secondary | ICD-10-CM

## 2020-05-11 LAB — POCT FERN TEST: POCT Fern Test: POSITIVE

## 2020-05-11 NOTE — MAU Note (Signed)
..  Rhonda Sparks is a 19 y.o. at [redacted]w[redacted]d here in MAU reporting: a "pop of fluids" that occurred at 2300. Pt reports a constant trickle that has not stopped. Pt has soaked through one pad and is currently wearing her second pad. Pt reports clear fluid that was pink tinged. +FM.    Onset of complaint: 05/11/20 at 2300 Pain score: 1/10 There were no vitals filed for this visit.   FHT: 145 initial FHR

## 2020-05-12 ENCOUNTER — Ambulatory Visit: Payer: Medicaid Other

## 2020-05-12 ENCOUNTER — Encounter (HOSPITAL_COMMUNITY): Payer: Self-pay | Admitting: Obstetrics and Gynecology

## 2020-05-12 ENCOUNTER — Inpatient Hospital Stay (HOSPITAL_COMMUNITY): Payer: Medicaid Other | Admitting: Anesthesiology

## 2020-05-12 DIAGNOSIS — O4202 Full-term premature rupture of membranes, onset of labor within 24 hours of rupture: Secondary | ICD-10-CM

## 2020-05-12 DIAGNOSIS — O4292 Full-term premature rupture of membranes, unspecified as to length of time between rupture and onset of labor: Secondary | ICD-10-CM | POA: Diagnosis present

## 2020-05-12 DIAGNOSIS — O3663X0 Maternal care for excessive fetal growth, third trimester, not applicable or unspecified: Secondary | ICD-10-CM | POA: Diagnosis present

## 2020-05-12 DIAGNOSIS — Z3A37 37 weeks gestation of pregnancy: Secondary | ICD-10-CM

## 2020-05-12 DIAGNOSIS — O1404 Mild to moderate pre-eclampsia, complicating childbirth: Secondary | ICD-10-CM | POA: Diagnosis present

## 2020-05-12 DIAGNOSIS — O26893 Other specified pregnancy related conditions, third trimester: Secondary | ICD-10-CM | POA: Diagnosis present

## 2020-05-12 DIAGNOSIS — Z20822 Contact with and (suspected) exposure to covid-19: Secondary | ICD-10-CM | POA: Diagnosis present

## 2020-05-12 DIAGNOSIS — O99824 Streptococcus B carrier state complicating childbirth: Secondary | ICD-10-CM | POA: Diagnosis present

## 2020-05-12 DIAGNOSIS — Z87891 Personal history of nicotine dependence: Secondary | ICD-10-CM | POA: Diagnosis not present

## 2020-05-12 LAB — CBC
HCT: 34.6 % — ABNORMAL LOW (ref 36.0–46.0)
Hemoglobin: 11.1 g/dL — ABNORMAL LOW (ref 12.0–15.0)
MCH: 28.9 pg (ref 26.0–34.0)
MCHC: 32.1 g/dL (ref 30.0–36.0)
MCV: 90.1 fL (ref 80.0–100.0)
Platelets: 202 10*3/uL (ref 150–400)
RBC: 3.84 MIL/uL — ABNORMAL LOW (ref 3.87–5.11)
RDW: 14.5 % (ref 11.5–15.5)
WBC: 12.3 10*3/uL — ABNORMAL HIGH (ref 4.0–10.5)
nRBC: 0.2 % (ref 0.0–0.2)

## 2020-05-12 LAB — TYPE AND SCREEN
ABO/RH(D): O POS
Antibody Screen: NEGATIVE

## 2020-05-12 LAB — SARS CORONAVIRUS 2 BY RT PCR (HOSPITAL ORDER, PERFORMED IN ~~LOC~~ HOSPITAL LAB): SARS Coronavirus 2: NEGATIVE

## 2020-05-12 LAB — RPR: RPR Ser Ql: NONREACTIVE

## 2020-05-12 LAB — ABO/RH: ABO/RH(D): O POS

## 2020-05-12 MED ORDER — SENNOSIDES-DOCUSATE SODIUM 8.6-50 MG PO TABS
2.0000 | ORAL_TABLET | ORAL | Status: DC
  Administered 2020-05-13: 2 via ORAL
  Filled 2020-05-12 (×2): qty 2

## 2020-05-12 MED ORDER — BENZOCAINE-MENTHOL 20-0.5 % EX AERO
1.0000 "application " | INHALATION_SPRAY | CUTANEOUS | Status: DC | PRN
  Administered 2020-05-12: 1 via TOPICAL
  Filled 2020-05-12: qty 56

## 2020-05-12 MED ORDER — ACETAMINOPHEN 325 MG PO TABS
650.0000 mg | ORAL_TABLET | Freq: Four times a day (QID) | ORAL | Status: DC | PRN
  Administered 2020-05-13 – 2020-05-14 (×2): 650 mg via ORAL
  Filled 2020-05-12 (×3): qty 2

## 2020-05-12 MED ORDER — EPHEDRINE 5 MG/ML INJ: 10.0000 mg | INTRAVENOUS | Status: DC | PRN

## 2020-05-12 MED ORDER — ONDANSETRON HCL 4 MG/2ML IJ SOLN: 4.0000 mg | Freq: Four times a day (QID) | INTRAMUSCULAR | Status: DC | PRN

## 2020-05-12 MED ORDER — FENTANYL-BUPIVACAINE-NACL 0.5-0.125-0.9 MG/250ML-% EP SOLN
12.0000 mL/h | EPIDURAL | Status: DC | PRN
  Filled 2020-05-12: qty 250

## 2020-05-12 MED ORDER — MEASLES, MUMPS & RUBELLA VAC IJ SOLR: 0.5000 mL | Freq: Once | INTRAMUSCULAR | Status: DC

## 2020-05-12 MED ORDER — IBUPROFEN 600 MG PO TABS
600.0000 mg | ORAL_TABLET | Freq: Three times a day (TID) | ORAL | Status: DC | PRN
  Administered 2020-05-12 – 2020-05-14 (×4): 600 mg via ORAL
  Filled 2020-05-12 (×4): qty 1

## 2020-05-12 MED ORDER — TETANUS-DIPHTH-ACELL PERTUSSIS 5-2.5-18.5 LF-MCG/0.5 IM SUSP: 0.5000 mL | Freq: Once | INTRAMUSCULAR | Status: DC

## 2020-05-12 MED ORDER — OXYCODONE-ACETAMINOPHEN 5-325 MG PO TABS: 2.0000 | ORAL_TABLET | ORAL | Status: DC | PRN

## 2020-05-12 MED ORDER — MISOPROSTOL 200 MCG PO TABS: 1000.0000 ug | ORAL_TABLET | Freq: Once | ORAL | Status: AC

## 2020-05-12 MED ORDER — OXYTOCIN-SODIUM CHLORIDE 30-0.9 UT/500ML-% IV SOLN: 2.5000 [IU]/h | INTRAVENOUS | Status: DC

## 2020-05-12 MED ORDER — LIDOCAINE HCL (PF) 1 % IJ SOLN: 30.0000 mL | INTRAMUSCULAR | Status: DC | PRN

## 2020-05-12 MED ORDER — MISOPROSTOL 200 MCG PO TABS
ORAL_TABLET | ORAL | Status: AC
  Administered 2020-05-12: 1000 ug via RECTAL
  Filled 2020-05-12: qty 5

## 2020-05-12 MED ORDER — SODIUM CHLORIDE 0.9 % IV SOLN
5.0000 10*6.[IU] | Freq: Once | INTRAVENOUS | Status: AC
  Administered 2020-05-12: 5 10*6.[IU] via INTRAVENOUS
  Filled 2020-05-12: qty 5

## 2020-05-12 MED ORDER — LACTATED RINGERS IV SOLN
500.0000 mL | Freq: Once | INTRAVENOUS | Status: AC
  Administered 2020-05-12: 500 mL via INTRAVENOUS

## 2020-05-12 MED ORDER — FENTANYL CITRATE (PF) 100 MCG/2ML IJ SOLN
100.0000 ug | INTRAMUSCULAR | Status: DC | PRN
  Administered 2020-05-12 (×3): 100 ug via INTRAVENOUS
  Filled 2020-05-12 (×3): qty 2

## 2020-05-12 MED ORDER — TERBUTALINE SULFATE 1 MG/ML IJ SOLN: 0.2500 mg | Freq: Once | INTRAMUSCULAR | Status: DC | PRN

## 2020-05-12 MED ORDER — PHENYLEPHRINE 40 MCG/ML (10ML) SYRINGE FOR IV PUSH (FOR BLOOD PRESSURE SUPPORT): 80.0000 ug | PREFILLED_SYRINGE | INTRAVENOUS | Status: DC | PRN

## 2020-05-12 MED ORDER — COCONUT OIL OIL: 1.0000 "application " | TOPICAL_OIL | Status: DC | PRN

## 2020-05-12 MED ORDER — DIBUCAINE (PERIANAL) 1 % EX OINT: 1.0000 "application " | TOPICAL_OINTMENT | CUTANEOUS | Status: DC | PRN

## 2020-05-12 MED ORDER — DIPHENHYDRAMINE HCL 25 MG PO CAPS: 25.0000 mg | ORAL_CAPSULE | Freq: Four times a day (QID) | ORAL | Status: DC | PRN

## 2020-05-12 MED ORDER — LACTATED RINGERS IV SOLN: INTRAVENOUS | Status: DC

## 2020-05-12 MED ORDER — PRENATAL MULTIVITAMIN CH
1.0000 | ORAL_TABLET | Freq: Every day | ORAL | Status: DC
  Administered 2020-05-13 – 2020-05-14 (×2): 1 via ORAL
  Filled 2020-05-12 (×2): qty 1

## 2020-05-12 MED ORDER — ONDANSETRON HCL 4 MG/2ML IJ SOLN: 4.0000 mg | INTRAMUSCULAR | Status: DC | PRN

## 2020-05-12 MED ORDER — ONDANSETRON HCL 4 MG PO TABS: 4.0000 mg | ORAL_TABLET | ORAL | Status: DC | PRN

## 2020-05-12 MED ORDER — WITCH HAZEL-GLYCERIN EX PADS: 1.0000 "application " | MEDICATED_PAD | CUTANEOUS | Status: DC | PRN

## 2020-05-12 MED ORDER — SIMETHICONE 80 MG PO CHEW: 80.0000 mg | CHEWABLE_TABLET | ORAL | Status: DC | PRN

## 2020-05-12 MED ORDER — FLEET ENEMA 7-19 GM/118ML RE ENEM: 1.0000 | ENEMA | RECTAL | Status: DC | PRN

## 2020-05-12 MED ORDER — PENICILLIN G POT IN DEXTROSE 60000 UNIT/ML IV SOLN
3.0000 10*6.[IU] | INTRAVENOUS | Status: DC
  Administered 2020-05-12 (×2): 3 10*6.[IU] via INTRAVENOUS
  Filled 2020-05-12 (×2): qty 50

## 2020-05-12 MED ORDER — OXYTOCIN BOLUS FROM INFUSION
333.0000 mL | Freq: Once | INTRAVENOUS | Status: AC
  Administered 2020-05-12: 333 mL via INTRAVENOUS

## 2020-05-12 MED ORDER — OXYCODONE-ACETAMINOPHEN 5-325 MG PO TABS: 1.0000 | ORAL_TABLET | ORAL | Status: DC | PRN

## 2020-05-12 MED ORDER — SODIUM CHLORIDE (PF) 0.9 % IJ SOLN
INTRAMUSCULAR | Status: DC | PRN
  Administered 2020-05-12: 12 mL/h via EPIDURAL

## 2020-05-12 MED ORDER — LACTATED RINGERS IV SOLN: 500.0000 mL | INTRAVENOUS | Status: DC | PRN

## 2020-05-12 MED ORDER — OXYTOCIN-SODIUM CHLORIDE 30-0.9 UT/500ML-% IV SOLN
1.0000 m[IU]/min | INTRAVENOUS | Status: DC
  Administered 2020-05-12: 2 m[IU]/min via INTRAVENOUS
  Filled 2020-05-12: qty 500

## 2020-05-12 MED ORDER — ACETAMINOPHEN 325 MG PO TABS: 650.0000 mg | ORAL_TABLET | ORAL | Status: DC | PRN

## 2020-05-12 MED ORDER — SOD CITRATE-CITRIC ACID 500-334 MG/5ML PO SOLN: 30.0000 mL | ORAL | Status: DC | PRN

## 2020-05-12 MED ORDER — LIDOCAINE HCL (PF) 1 % IJ SOLN
INTRAMUSCULAR | Status: DC | PRN
  Administered 2020-05-12 (×2): 4 mL via EPIDURAL

## 2020-05-12 MED ORDER — DIPHENHYDRAMINE HCL 50 MG/ML IJ SOLN: 12.5000 mg | INTRAMUSCULAR | Status: DC | PRN

## 2020-05-12 NOTE — Progress Notes (Signed)
Patient ID: Bobbie Stack, female   DOB: [birth year], 18 y.o.   MRN: [id number]  States ctx have spaced out; hasn't requested anything for pain yet; will receive 2nd dose of PCN at 0700  BP 136/64, P 85 FHR 130s, +accels, no decels, Cat 1 Ctx irreg mild 3-8 mins Cx 1/70/vtx -3  IUP@37 .1wks PROM GBS pos Cx unfavorable  -Cervical foley inserted without difficulty and inflated to 60cc -Plan for Pitocin when foley comes out -Continue PCN for GBS ppx -Anticipate vag del  Arabella Merles [facility] 05/12/2020 6:39 AM
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
## 2020-05-12 NOTE — Anesthesia Preprocedure Evaluation (Signed)
Anesthesia Evaluation  Patient identified by MRN, date of birth, ID band Patient awake    Reviewed: Allergy & Precautions, Patient's Chart, lab work & pertinent test results  History of Anesthesia Complications Negative for: history of anesthetic complications  Airway Mallampati: II  TM Distance: >3 FB Neck ROM: Full    Dental no notable dental hx.    Pulmonary former smoker,    Pulmonary exam normal        Cardiovascular negative cardio ROS Normal cardiovascular exam     Neuro/Psych  Headaches, negative psych ROS   GI/Hepatic negative GI ROS, Neg liver ROS,   Endo/Other  negative endocrine ROS  Renal/GU negative Renal ROS  negative genitourinary   Musculoskeletal negative musculoskeletal ROS (+)   Abdominal   Peds  Hematology negative hematology ROS (+)   Anesthesia Other Findings Day of surgery medications reviewed with patient.  Reproductive/Obstetrics (+) Pregnancy                             Anesthesia Physical Anesthesia Plan  ASA: II  Anesthesia Plan: Epidural   Post-op Pain Management:    Induction:   PONV Risk Score and Plan: Treatment may vary due to age or medical condition  Airway Management Planned: Natural Airway  Additional Equipment:   Intra-op Plan:   Post-operative Plan:   Informed Consent: I have reviewed the patients History and Physical, chart, labs and discussed the procedure including the risks, benefits and alternatives for the proposed anesthesia with the patient or authorized representative who has indicated his/her understanding and acceptance.       Plan Discussed with: CRNA  Anesthesia Plan Comments:         Anesthesia Quick Evaluation

## 2020-05-12 NOTE — Lactation Note (Signed)
This note was copied from a baby's chart. Lactation Consultation Note  Patient Name: Rhonda Sparks FBPZW'C Date: 05/12/2020 Reason for consult: Initial assessment;Early term 37-38.6wks;Mother's request;1st time breastfeeding;Primapara;Difficult latch Baby 6hrs old, received a call from A.Crews, RN reports mom attempting to latch baby to breast but unsuccessful, mom considering giving formula, requests to see LC first. Upon entering room mom sitting in bed bundled, c/o feeling cold, baby resting on bed, maternal grandmother and RN at bedside. Got baby up skin to skin several attempted to latch to right breast football hold, baby latches, sucks 1-2 times and falls off. Switched to modified laid back/ cradle position, LC able to maintain latch by holding breast in baby's mouth, mom tried several time but unsuccessful. Mom with short nipples bilat, evert slightly with stimulation. Fitted mom with 17mm nipple shield, mom latched baby with some assistance from [facility]. Advised no LC overnight, latch baby with use of shield, hand express after each feeding/ each attempt at the breast (skin to skin), offer EBM back to baby, expect 1 teaspoon. This LC taught hand expression, mom demonstrated (1-2 drops easily expressed), reports comfort with plan. Mom falling asleep while reviewing plan, encouraged to rest, feed baby on cue, optimal skin to skin, reviewed typical newborn behavior first 24-48hrs, mom voiced understanding and with no further concerns, maternal grandmother to hold baby while mom rests. Advised mom will reassess latch in AM, if mom desires to continue with NS will discuss pumping plan at that time. BGilliam, RN, IBCLC  Maternal Data Has patient been taught Hand Expression?: Yes Does the patient have breastfeeding experience prior to this delivery?: No  Feeding Feeding Type: Breast Fed  LATCH Score Latch: Repeated attempts needed to sustain latch, nipple held in mouth throughout feeding,  stimulation needed to elicit sucking reflex.  Audible Swallowing: A few with stimulation  Type of Nipple: Everted at rest and after stimulation (short nipples)  Comfort (Breast/Nipple): Soft / non-tender  Hold (Positioning): Full assist, staff holds infant at breast  LATCH Score: 6  Interventions Interventions: Breast feeding basics reviewed;Assisted with latch;Skin to skin;Hand express;Breast compression;Adjust position;Support pillows;Position options (58mm nipple shield)  Lactation Tools Discussed/Used     Consult Status Consult Status: Follow-up Date: 05/13/20 Follow-up type: In-patient    Charlynn Court 05/12/2020, 9:03 PM
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
## 2020-05-12 NOTE — Plan of Care (Signed)
  Problem: Education: Goal: Knowledge of Childbirth will improve Outcome: Progressing   Problem: Pain Management: Goal: Relief or control of pain from uterine contractions will improve Outcome: Progressing   Problem: Safety: Goal: Ability to remain free from injury will improve Outcome: Progressing   

## 2020-05-12 NOTE — Discharge Summary (Addendum)
Postpartum Discharge Summary    Patient Name: Rhonda Sparks DOB: 11/14/2001 MRN: [id number]  Date of admission: 05/11/2020 Delivery date:05/12/2020  Delivering provider: Eulis Foster  Date of discharge: 05/14/2020  Admitting diagnosis: Normal labor [O80, Z37.9] Intrauterine pregnancy: [redacted]w[redacted]d    Secondary diagnosis:  Active Problems:   New daily persistent headache   Spondylolysis   Incarceration   Group B Streptococcus carrier, +RV culture, currently pregnant   Macrosomia   Normal labor Gestational Hypertension vs mild Pre-E (unable to obtain P/C ratio)  Additional problems: None    Discharge diagnosis: Term Pregnancy Delivered and Gestational Hypertension                                              Post partum procedures:None Augmentation: Pitocin and IP Foley Complications: None  Hospital course: Induction of Labor With Vaginal Delivery   19y.o. yo G1P0 at 324w1das admitted to the hospital 05/11/2020 for induction of labor.  Indication for induction: PROM.  Patient had a labor course as follows: Initial SVE: 1/70/-3. Patient received FB and Pitocin and progressed to complete with uncomplicated delivery. Elevated BP in labor. Started on Norvasc 5 mg on PPD#1. Pt declined. Counseling of Pre-E risk done.  Membrane Rupture Time/Date: 11:00 PM ,05/11/2020   Delivery Method:Vaginal, Spontaneous  Episiotomy: None  Lacerations:  1st degree;Vaginal  Details of delivery can be found in separate delivery note. BP's monitored PP and ranged from 108-129/54-81 over the 24 hour period prior to discharge. Patient had a routine postpartum course otherwise. Patient is discharged home 05/14/20.  Newborn Data: Birth date:05/12/2020  Birth time:2:52 PM  Gender:Female  Living status:Living  Apgars:9 ,9  Weight:3105 g   Magnesium Sulfate received: No BMZ received: No Rhophylac:N/A MMR:N/A T-DaP:Given prenatally Flu: No Transfusion:No  Physical exam  Vitals:    05/13/20 0519 05/13/20 1509 05/13/20 2118 05/14/20 0527  BP: 129/65 108/81 111/70 (!) 114/54  Pulse: 96 96 94 (!) 102  Resp: _0 Temp: 98.4 F (36.9 C) 98.8 F (37.1 C) 97.6 F (36.4 C) 97.7 F (36.5 C)  TempSrc: Oral Oral Oral Oral  SpO2: 98% 100%  99%  Weight:      Height:       General: alert, cooperative and no distress Lochia: appropriate Uterine Fundus: firm Incision: N/A DVT Evaluation: No evidence of DVT seen on physical exam. No significant calf/ankle edema. Labs: Lab Results  Component Value Date   WBC 12.3 (H) 05/12/2020   HGB 11.1 (L) 05/12/2020   HCT 34.6 (L) 05/12/2020   MCV 90.1 05/12/2020   PLT 202 05/12/2020   CMP Latest Ref Rng & Units 10/25/2019  Glucose 70 - 99 mg/dL 94  BUN 6 - 20 mg/dL 11  Creatinine 0.44 - 1.00 mg/dL 0.59  Sodium 135 - 145 mmol/L 134(L)  Potassium 3.5 - 5.1 mmol/L 3.4(L)  Chloride 98 - 111 mmol/L 102  CO2 22 - 32 mmol/L 22  Calcium 8.9 - 10.3 mg/dL 9.1   Edinburgh Score: Edinburgh Postnatal Depression Scale Screening Tool 05/13/2020  I have been able to laugh and see the funny side of things. 0  I have looked forward with enjoyment to things. 0  I have blamed myself unnecessarily when things went wrong. 0  I have been anxious or worried for no good reason. 0  I have felt  scared or panicky for no good reason. 0  Things have been getting on top of me. 0  I have been so unhappy that I have had difficulty sleeping. 0  I have felt sad or miserable. 0  I have been so unhappy that I have been crying. 0  The thought of harming myself has occurred to me. 0  Edinburgh Postnatal Depression Scale Total 0     After visit meds:  Allergies as of 05/14/2020   No Known Allergies     Medication List    STOP taking these medications   terconazole 0.4 % vaginal cream Commonly known as: TERAZOL 7     TAKE these medications   Blood Pressure Kit 1 Units by Does not apply route once a week.   ibuprofen 600 MG  tablet Commonly known as: ADVIL Take 1 tablet (600 mg total) by mouth every 8 (eight) hours as needed for mild pain.   prenatal vitamin w/FE, FA 27-1 MG Tabs tablet Take 1 tablet by mouth daily at 12 noon.        Discharge home in stable condition Infant Feeding: Breast Infant Disposition:home with mother Discharge instruction: per After Visit Summary and Postpartum booklet. Activity: Advance as tolerated. Pelvic rest for 6 weeks.  Diet: routine diet Future Appointments: Future Appointments  Date Time Provider [place]  05/19/2020  2:00 PM [facility] NURSE [facility]  06/14/2020  1:15 PM Starr Lake, CNM [facility]   Follow up Visit:   Please schedule this patient for a Virtual postpartum visit in 4 weeks with the following provider: Any provider. Additional Postpartum F/U:BP check 1 week  Low risk pregnancy complicated by: none Delivery mode:  Vaginal, Spontaneous  Anticipated Birth Control:  Unsure   05/14/2020 Eulis Foster, MD  CNM attestation I have seen and examined this patient and agree with above documentation in the resident's note.   Rhonda Sparks is a 19 y.o. G1P1001 s/p vag del.   Pain is well controlled.  Plan for birth control is declines.  Method of Feeding: breast  PE:  BP (!) 114/54 (BP Location: Right Arm)   Pulse (!) 102   Temp 97.7 F (36.5 C) (Oral)   Resp 18   Ht _0  (1.549 m)   Wt 92.1 kg   LMP 08/14/2019 (Exact Date)   SpO2 99%   Breastfeeding Unknown   BMI 38.36 kg/m  Fundus firm  No results for input(s): HGB, HCT in the last 72 hours.   Plan: discharge today - postpartum care discussed - f/u clinic in 1wk for BP check, then in 4-6 weeks for postpartum visit   Myrtis Ser, CNM 1:03 AM

## 2020-05-12 NOTE — Progress Notes (Signed)
Report given to L&D Charge Nurse. Informed CN that Philipp Deputy stated the bedside U/S would be performed on L&D to confirm fetal presentation.

## 2020-05-12 NOTE — Anesthesia Procedure Notes (Signed)
Epidural Patient location during procedure: OB Start time: 05/12/2020 11:55 AM End time: 05/12/2020 11:58 AM  Staffing Anesthesiologist: Kaylyn Layer, MD Performed: anesthesiologist   Preanesthetic Checklist Completed: patient identified, IV checked, risks and benefits discussed, monitors and equipment checked, pre-op evaluation and timeout performed  Epidural Patient position: sitting Prep: DuraPrep and site prepped and draped Patient monitoring: continuous pulse ox, blood pressure and heart rate Approach: midline Location: L3-L4 Injection technique: LOR air  Needle:  Needle type: Tuohy  Needle gauge: 17 G Needle length: 9 cm Needle insertion depth: 7 cm Catheter type: closed end flexible Catheter size: 19 Gauge Catheter at skin depth: 12 cm Test dose: negative and Other (1% lidocaine)  Assessment Events: blood not aspirated, injection not painful, no injection resistance, no paresthesia and negative IV test  Additional Notes Patient identified. Risks, benefits, and alternatives discussed with patient including but not limited to bleeding, infection, nerve damage, paralysis, failed block, incomplete pain control, headache, blood pressure changes, nausea, vomiting, reactions to medication, itching, and postpartum back pain. Confirmed with bedside nurse the patient's most recent platelet count. Confirmed with patient that they are not currently taking any anticoagulation, have any bleeding history, or any family history of bleeding disorders. Patient expressed understanding and wished to proceed. All questions were answered. Sterile technique was used throughout the entire procedure. Please see nursing notes for vital signs.   Crisp LOR on first pass. Test dose was given through epidural catheter and negative prior to continuing to dose epidural or start infusion. Warning signs of high block given to the patient including shortness of breath, tingling/numbness in hands, complete  motor block, or any concerning symptoms with instructions to call for help. Patient was given instructions on fall risk and not to get out of bed. All questions and concerns addressed with instructions to call with any issues or inadequate analgesia.  Reason for block:procedure for pain
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
## 2020-05-13 MED ORDER — AMLODIPINE BESYLATE 5 MG PO TABS
5.0000 mg | ORAL_TABLET | Freq: Every day | ORAL | Status: DC
  Filled 2020-05-13: qty 1

## 2020-05-13 NOTE — Lactation Note (Signed)
This note was copied from a baby's chart. Lactation Consultation Note  Patient Name: Boy Lunetta Marina ZYSAY'T Date: 05/13/2020   LC attempted to see mom.  Mom sleeping.   RN, Michelle Nasuti, to let [facility] know when mom wakes up.   Maternal Data    Feeding Feeding Type:  (enc grandmother to feed infant) Nipple Type: Slow - flow  LATCH Score                   Interventions    Lactation Tools Discussed/Used     Consult Status      Makayela Secrest Michaelle Copas 05/13/2020, 5:44 PM

## 2020-05-13 NOTE — Progress Notes (Signed)
CSW was notified that MOB doesn't have all items needed for infant (MOB reported to this CSW during assessment that she did). CSW provided MOB with Baby Box and Baby Bundle as MOB reported that she needed a bed for infant as well as clothes. CSW asked MOB if there were an other needs at this time and MON denies having any other needs.      Claude Manges Gae Bihl, MSW, LCSW [facility] [phone]

## 2020-05-13 NOTE — Progress Notes (Signed)
Pt refused bp medication. Educated importance of controlling blood pressure.

## 2020-05-13 NOTE — Anesthesia Postprocedure Evaluation (Signed)
Anesthesia Post Note  Patient: Rhonda Sparks  Procedure(s) Performed: AN AD HOC LABOR EPIDURAL     Patient location during evaluation: Mother Baby Anesthesia Type: Epidural Level of consciousness: awake and alert, oriented and patient cooperative Pain management: pain level controlled Vital Signs Assessment: post-procedure vital signs reviewed and stable Respiratory status: spontaneous breathing Cardiovascular status: stable Postop Assessment: no headache, epidural receding, patient able to bend at knees and no signs of nausea or vomiting Anesthetic complications: no Comments: Pt. States she is walking.  Pain score 1.    No complications documented.  Last Vitals:  Vitals:   05/13/20 0130 05/13/20 0519  BP: 124/69 129/65  Pulse: 96 96  Resp: 18 18  Temp: 36.9 C 36.9 C  SpO2:  98%    Last Pain:  Vitals:   05/13/20 0519  TempSrc: Oral  PainSc: 0-No pain   Pain Goal:                   [facility]

## 2020-05-13 NOTE — Progress Notes (Addendum)
Post Partum Day 1  Subjective:  Rhonda Sparks is a 19 y.o. G1P1001 [redacted]w[redacted]d s/p vaginal delivery.  No acute events overnight.  Pt denies problems with ambulating, voiding or po intake.  She denies nausea or vomiting.  Pain is well controlled.  She has had flatus. She has not had bowel movement.  Lochia Small.  Plan for birth control is no method.  Method of Feeding: breast.   Objective: BP 129/65 (BP Location: Right Arm)   Pulse 96   Temp 98.4 F (36.9 C) (Oral)   Resp 18   Ht 5\' 1"  (1.549 m)   Wt 92.1 kg   LMP 08/14/2019 (Exact Date)   SpO2 98%   Breastfeeding Unknown   BMI 38.36 kg/m   Physical Exam:  General: alert, cooperative and no distress Lochia:normal flow Chest: CTAB Heart: RRR no m/r/g Abdomen: +BS, soft, nontender, fundus firm at/below umbilicus Uterine Fundus: firm DVT Evaluation: No evidence of DVT seen on physical exam. Extremities: no LE edema  Recent Labs    05/12/20 0003  HGB 11.1*  HCT 34.6*    Assessment/Plan:  ASSESSMENT: Rhonda Sparks is a 19 y.o. G1P1001 [redacted]w[redacted]d ppd #1 s/p NSVD doing well.   Plan for discharge tomorrow Routine PP care  Elevated BP: max sys 141 ( measurements of 129 and 131) systolic since delivering,  adding Norvasc 5mg  if BP becomes elevated >130   LOS: 1 day   Makiera Simmons-Robinson 05/13/2020, 8:16 AM   I was present for the exam and agree with above. Started Norvasc 5 mg PO QD. Hold for SBP <130.  Pt declined. Discussed rationale for recommending BP meds and likelihood that BP will risk more over the next week. Pt and support person will think about it.   , 05/15/2020, CNM 05/13/2020 2:01 PM

## 2020-05-13 NOTE — Clinical Social Work Maternal (Signed)
CLINICAL SOCIAL WORK MATERNAL/CHILD NOTE  Patient Details  Name: Rhonda Sparks MRN: [id number] Date of Birth: [birth year]  Date:  05/13/2020  Clinical Social Worker Initiating Note:  Durward Fortes, LCSW Date/Time: Initiated:  05/13/20/0945     Child's Name:  Rhonda Sparks   Biological Parents:  Mother, Father Nottingham [place], Darrion Nori Riis)   Need for Interpreter:  None   Reason for Referral:  Current Substance Use/Substance Use During Pregnancy , Other (Comment) (MOB on house arrest.)   Address:  [address] [place] [address]    Phone number:  [phone] (home)     Additional phone number: none   Household Members/Support Persons (HM/SP):   Household Member/Support Person 1, Household Member/Support Person 2   HM/SP Name Relationship DOB or Age  HM/SP -[place] MOB 19   HM/SP -[place] Brother   53  HM/SP -3  Izet Asencion Partridge     HM/SP -[place]  sister   35  HM/SP -5   Lauralyn Primes  brother   26  HM/SP -6        HM/SP -7        HM/SP -8          Natural Supports (not living in the home):  Extended Family   Professional Supports: None   Employment: Part-time   Type of Work: [employer]   Education:  Other (comment) (GED)   Homebound arranged:  n/a  Financial Resources:  Medicaid   Other Resources:  Physicist, medical , [place] Considerations Which May Impact Care:  none reported.   Strengths:  Ability to meet basic needs , Compliance with medical plan , Home prepared for child , Pediatrician chosen   Psychotropic Medications:     None reported to CSW.     Pediatrician:    [place]  Pediatrician List:   [facility] ([address])  [place]      Pediatrician Fax Number:    Risk Factors/Current Problems:  Substance Use , Legal Issues  (MOB on  house arrest)   Cognitive State:  Able to Concentrate , Insightful , Alert    Mood/Affect:  Relaxed , Comfortable , Calm , Interested , Happy    CSW Assessment: CSW consulted as MOB reported THC use during pregnancy as well as MOB on house arrest per consult. CSW met with MOB at bedside to address further needs.   CSW congratulated MOB on the birth of infant. CSW advised MOB of CSW's role and the reason for CSW coming to visit with her. MOB reported that she did use THC prior to pregnancy but once she found out that she was pregnant, she stopped. MOB reported that she found out that she was pregnant on Thanksgiving Day and reported that her last use was around that time. MOB does report that she wasn't aware of pregnancy at the time of use but assured CSW that she stopped once pregnancy was confirmed. CSW understanding and still took time to advise MOB of the hospital drug screen policy. CSW advised MOB that infants UDS is negative, however CSW would need to monitor infants CDS and make CPS report if warranted. MOB reported that she understood and reported that she didn't take any other substances while pregnant. MOB reported no previous CPS  hx and denied any questions regarding why infant was being drug screened.   CSW inquired from MOB on her mental health hx in which MOB denies having any. CSW inquired from [facility] on her legal concerns and CSW was notified that there are no concerns regarding this at this time. CSW understanding of this and asked MOB about items for infant. MOB reports that she has all needed items to care for infant but does request more pampers. CSW advised MOB that CSW would make referral for [facility] to better assist with this, MOB agreeable. CSW was informed that MOB has support from FOB and her family. MOB reported that she has been feeling fine since she gave birth and reported no other needs. MOB was bonding well with infant as evident by MOB feeding infant and  holding infant to soothe infant when crying.   CSW took time to provide MOB with PPD and SIDS education. MOB Was given PPD Checklist as well as Therapy resources as MOB reported a desire to be in therapy. MOB reported that that infant will sleep in pack n' play once arrived home and will be seen at [facility] for follow up care.   CSW will continue to monitor infants CDS and make CPS report if warranted.   CSW Plan/Description:  No Further Intervention Required/No Barriers to Discharge, Sudden Infant Death Syndrome (SIDS) Education, Perinatal Mood and Anxiety Disorder (PMADs) Education, CSW Will Continue to Monitor Umbilical Cord Tissue Drug Screen Results and Make Report if [facility], [place], Other Information/Referral to [employer], [place] 05/13/2020, 10:20 AM

## 2020-05-13 NOTE — Lactation Note (Signed)
This note was copied from a Rhonda's chart. Lactation Consultation Note  Patient Name: Rhonda Sparks Today's Date: 05/13/2020  Rhonda Sparks now 34 hours old.   Telephone call from RN that mom wants assistance with breastfeeding.  Entered room and mom in bathroom and infant with pacifier. Infant started crying and pacifier fell out.  Asked grandmother if I could hold him.  She agreed.  Mom came out of bathroom and reported that she had pumped a few times but did not get anything.  Explained that was very normal.  Urged mom to continue to pump everytime Rhonda takes a bottle.   Discussed bottle and pacifier use with mom.  Mom reports she really wants to breastfeed and pump when goes back to work but he is not latching well.   Urged mom to follow up with [facility] regarding DEBP.  Grandmother reports she thinks they got the medicaid plan that covers DEBP.   Attempt to latch him and he is hungry and fussy so applied 24 mm nipple shield on mom.  Showed mom how to apply it.  Demo a few times.  Showed mom how to hand express prior to breastfeeding too get the milk started for him.  Mom also able to hand express small drops of colosotrum.  Assisted mom in feeding him in cross cradle hold on right breast.  Infant bitty/clamping at first.  After a few minutes he got more rythmic and good breastfeeding observed. A Few swallows seen but not heard. Mom tends to let him slide a little.  Urged her to keep his chin and cheeks touching breast.  Showed mom how to hold her breast for him, to help him latch.  Showed him how his mouth needed to be open wide and lips flanged.  Mom reported uncomfortable at first but as he got better she reported comfort. Left mom and Rhonda breastfeeding.  Urged mom to call lactation as needed.  Discussed other ways to offer supplement besides a bottle. Infant with questionable tongue restriction.     Feeding Feeding Type: Bottle Fed - Formula Nipple Type: Slow - flow  LATCH Score                    Interventions    Lactation Tools Discussed/Used     Consult Status      Rhonda Sparks 05/13/2020, 9:56 PM

## 2020-05-14 MED ORDER — IBUPROFEN 600 MG PO TABS: 600.0000 mg | ORAL_TABLET | Freq: Three times a day (TID) | ORAL | 0 refills | Status: DC | PRN

## 2020-05-14 NOTE — Lactation Note (Signed)
This note was copied from a baby's chart. Lactation Consultation Note  Patient Name: Rhonda Sparks WNUUV'O Date: 05/14/2020 Reason for consult: Follow-up assessment;Other (Comment) (baby is being fed mostly bottles/ see LC note)  Baby is 60 hours old  Mom woke up as LC entered the room.  Per mom plan to breast fed once the milk comes in .  [facility] reviewed supply and demand / importance of giving the baby the opportunity to help her  At the breast to bring he milk in.  Sore nipple and engorgement prevention and tx reviewed.  Per mom has a hand pump and plans to call [facility] for a DEBP if needed.  Mom has the [facility] pamphlet with phone numbers if needed.    Maternal Data    Feeding Feeding Type: Formula  LATCH Score                   Interventions Interventions: Breast feeding basics reviewed  Lactation Tools Discussed/Used Pump Review: Milk Storage   Consult Status Consult Status: Complete Date: 05/14/20    Matilde Sprang Kayven Aldaco 05/14/2020, 12:12 PM

## 2020-05-14 NOTE — Discharge Instructions (Signed)

## 2020-05-19 ENCOUNTER — Ambulatory Visit: Payer: Medicaid Other

## 2020-05-24 ENCOUNTER — Encounter: Payer: Medicaid Other | Admitting: Advanced Practice Midwife

## 2020-05-31 ENCOUNTER — Encounter: Payer: Medicaid Other | Admitting: Student

## 2020-06-07 ENCOUNTER — Encounter: Payer: Medicaid Other | Admitting: Student

## 2020-06-14 ENCOUNTER — Other Ambulatory Visit: Payer: Self-pay

## 2020-06-14 ENCOUNTER — Telehealth (INDEPENDENT_AMBULATORY_CARE_PROVIDER_SITE_OTHER): Payer: Medicaid Other | Admitting: Student

## 2020-06-14 DIAGNOSIS — Z91199 Patient's noncompliance with other medical treatment and regimen due to unspecified reason: Secondary | ICD-10-CM

## 2020-06-14 DIAGNOSIS — Z5329 Procedure and treatment not carried out because of patient's decision for other reasons: Secondary | ICD-10-CM

## 2020-06-14 NOTE — Progress Notes (Signed)
Patient ID: Rhonda Sparks, female   DOB: Mar 29, 2001, 19 y.o.   MRN: [id number] Patient did not keep appt

## 2020-06-14 NOTE — Progress Notes (Signed)
1:15p- Called pt for My Chart visit, no answer, left VM that will call back in 10 to 15 minutes.   1:39P- 2nd attempt, still no answer, pt needs to be rescheduled.

## 2020-07-05 ENCOUNTER — Ambulatory Visit (HOSPITAL_COMMUNITY)
Admission: EM | Admit: 2020-07-05 | Discharge: 2020-07-05 | Disposition: A | Discharge status: Home / Self Care | Payer: Medicaid Other | Attending: Urgent Care | Admitting: Urgent Care

## 2020-07-05 ENCOUNTER — Telehealth: Payer: Self-pay | Admitting: Family Medicine

## 2020-07-05 ENCOUNTER — Other Ambulatory Visit: Payer: Self-pay

## 2020-07-05 ENCOUNTER — Encounter (HOSPITAL_COMMUNITY): Payer: Self-pay

## 2020-07-05 DIAGNOSIS — Z79899 Other long term (current) drug therapy: Secondary | ICD-10-CM | POA: Insufficient documentation

## 2020-07-05 DIAGNOSIS — G44209 Tension-type headache, unspecified, not intractable: Secondary | ICD-10-CM | POA: Diagnosis not present

## 2020-07-05 DIAGNOSIS — R519 Headache, unspecified: Secondary | ICD-10-CM

## 2020-07-05 DIAGNOSIS — U071 COVID-19: Secondary | ICD-10-CM | POA: Insufficient documentation

## 2020-07-05 DIAGNOSIS — J069 Acute upper respiratory infection, unspecified: Secondary | ICD-10-CM | POA: Diagnosis not present

## 2020-07-05 DIAGNOSIS — M549 Dorsalgia, unspecified: Secondary | ICD-10-CM | POA: Diagnosis not present

## 2020-07-05 DIAGNOSIS — R0981 Nasal congestion: Secondary | ICD-10-CM

## 2020-07-05 DIAGNOSIS — Z791 Long term (current) use of non-steroidal anti-inflammatories (NSAID): Secondary | ICD-10-CM | POA: Insufficient documentation

## 2020-07-05 DIAGNOSIS — Z87891 Personal history of nicotine dependence: Secondary | ICD-10-CM | POA: Diagnosis not present

## 2020-07-05 LAB — SARS CORONAVIRUS 2 (TAT 6-24 HRS): SARS Coronavirus 2: POSITIVE — AB

## 2020-07-05 MED ORDER — TIZANIDINE HCL 4 MG PO TABS: 4.0000 mg | ORAL_TABLET | Freq: Every day | ORAL | 0 refills | Status: DC

## 2020-07-05 MED ORDER — BENZONATATE 100 MG PO CAPS: 100.0000 mg | ORAL_CAPSULE | Freq: Three times a day (TID) | ORAL | 0 refills | Status: DC | PRN

## 2020-07-05 MED ORDER — PROMETHAZINE-DM 6.25-15 MG/5ML PO SYRP
5.0000 mL | ORAL_SOLUTION | Freq: Every evening | ORAL | 0 refills | Status: DC | PRN
Start: 2020-07-05 — End: 2020-11-23

## 2020-07-05 MED ORDER — CETIRIZINE HCL 10 MG PO TABS: 10.0000 mg | ORAL_TABLET | Freq: Every day | ORAL | 0 refills | Status: DC

## 2020-07-05 MED ORDER — PSEUDOEPHEDRINE HCL 60 MG PO TABS
60.0000 mg | ORAL_TABLET | Freq: Three times a day (TID) | ORAL | 0 refills | Status: DC | PRN
Start: 2020-07-05 — End: 2020-11-23

## 2020-07-05 MED ORDER — NAPROXEN 500 MG PO TABS
500.0000 mg | ORAL_TABLET | Freq: Two times a day (BID) | ORAL | 0 refills | Status: DC
Start: 2020-07-05 — End: 2021-05-30

## 2020-07-05 NOTE — Discharge Instructions (Signed)
We will notify you of your COVID-19 test results as they arrive and may take between 24 to 48 hours.  I encourage you to sign up for MyChart if you have not already done so as this can be the easiest way for [place] to communicate results to you online or through a phone app.  In the meantime, if you develop worsening symptoms including fever, chest pain, shortness of breath despite our current treatment plan then please report to the emergency room as this may be a sign of worsening status from possible COVID-19 infection.  Otherwise, we will manage this as a viral syndrome. For sore throat or cough try using a honey-based tea. Use 3 teaspoons of honey with juice squeezed from half lemon. Place shaved pieces of ginger into 1/2-1 cup of water and warm over stove top. Then mix the ingredients and repeat every 4 hours as needed. Please take Tylenol 500mg -650mg  every 6 hours for aches and pains, fevers. Hydrate very well with at least 2 liters of water. Eat light meals such as soups to replenish electrolytes and soft fruits, veggies. Start an antihistamine like Zyrtec, Allegra or Claritin for postnasal drainage, sinus congestion.  You can take this together with pseudoephedrine (Sudafed) at a dose of 60 mg 2-3 times a day as needed for the same kind of congestion.     Naproxen for pains, headaches. Tizanidine at night for back spasms.
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
## 2020-07-05 NOTE — Telephone Encounter (Signed)
Spoke with patient in regards to getting her scheduled for a postpartum visit. Patient scheduled for 9/7. Patient stated that she needs a prescription for prenatal vitamins. Patient instructed that a message will be sent to the nurses and they will contact her as soon as they can.

## 2020-07-06 ENCOUNTER — Other Ambulatory Visit: Payer: Self-pay

## 2020-07-06 DIAGNOSIS — Z3480 Encounter for supervision of other normal pregnancy, unspecified trimester: Secondary | ICD-10-CM

## 2020-07-06 MED ORDER — PRENATAL PLUS 27-1 MG PO TABS: 1.0000 | ORAL_TABLET | Freq: Every day | ORAL | 6 refills | Status: DC

## 2020-07-06 NOTE — Progress Notes (Signed)
Patient called about wanting prenatal vitamins. Sent to pharmacy and LVM that an rx was available to her.

## 2020-07-26 ENCOUNTER — Ambulatory Visit: Payer: Medicaid Other | Admitting: Certified Nurse Midwife

## 2020-09-28 ENCOUNTER — Other Ambulatory Visit: Payer: Self-pay

## 2020-09-28 ENCOUNTER — Encounter (HOSPITAL_COMMUNITY): Payer: Self-pay

## 2020-09-28 ENCOUNTER — Emergency Department (HOSPITAL_COMMUNITY)
Admission: EM | Admit: 2020-09-28 | Discharge: 2020-09-29 | Disposition: A | Discharge status: Home / Self Care | Payer: Medicaid Other | Attending: Emergency Medicine | Admitting: Emergency Medicine

## 2020-09-28 DIAGNOSIS — T192XXA Foreign body in vulva and vagina, initial encounter: Secondary | ICD-10-CM | POA: Diagnosis present

## 2020-09-28 DIAGNOSIS — Y33XXXA Other specified events, undetermined intent, initial encounter: Secondary | ICD-10-CM | POA: Diagnosis not present

## 2020-09-28 DIAGNOSIS — Z87891 Personal history of nicotine dependence: Secondary | ICD-10-CM | POA: Diagnosis not present

## 2020-09-28 DIAGNOSIS — Z01419 Encounter for gynecological examination (general) (routine) without abnormal findings: Secondary | ICD-10-CM | POA: Insufficient documentation

## 2020-09-28 DIAGNOSIS — F1210 Cannabis abuse, uncomplicated: Secondary | ICD-10-CM | POA: Insufficient documentation

## 2020-09-28 NOTE — ED Triage Notes (Signed)
Pt reports that she has a tampon stuck

## 2020-09-29 NOTE — ED Notes (Signed)
Patient verbalizes understanding of discharge instructions. Opportunity for questioning and answers were provided. Armband removed by staff, pt discharged from ED stable & ambulatory  
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
## 2020-09-29 NOTE — Discharge Instructions (Signed)
There was no tampon present in your vagina.  However, if you are ever concerned about a foreign body such as a tampon, do not wait more than 24 hours before having it removed.  Waiting longer than that puts you at risk for serious infection called toxic shock syndrome.

## 2020-11-19 NOTE — L&D Delivery Note (Signed)
Delivery Note At 1121 a viable female infant was delivered via SVD, presentation: OA. APGAR: 9, 9; weight pending.   Placenta status: spontaneously delivered intact with gentle cord traction. Fundus firm with massage and Pitocin.   Anesthesia: epidural Lacerations: superficial 1st degree vaginal, hemostatic Suture used for repair: n/a Est. Blood Loss (mL): 100 Placenta to LD Complications none Cord ph n/a   Mom to postpartum. Baby to Couplet care / Skin to Skin.    Donette Larry, CNM 11/13/2021 11:37 AM

## 2020-11-23 ENCOUNTER — Ambulatory Visit (INDEPENDENT_AMBULATORY_CARE_PROVIDER_SITE_OTHER): Payer: Medicaid Other | Admitting: *Deleted

## 2020-11-23 ENCOUNTER — Other Ambulatory Visit: Payer: Self-pay

## 2020-11-23 ENCOUNTER — Other Ambulatory Visit (HOSPITAL_COMMUNITY)
Admission: RE | Admit: 2020-11-23 | Discharge: 2020-11-23 | Disposition: A | Discharge status: Home / Self Care | Payer: Medicaid Other | Source: Ambulatory Visit | Attending: Family Medicine | Admitting: Family Medicine

## 2020-11-23 VITALS — BP 127/83 | HR 94 | Ht 61.0 in | Wt 188.3 lb

## 2020-11-23 DIAGNOSIS — N898 Other specified noninflammatory disorders of vagina: Secondary | ICD-10-CM | POA: Insufficient documentation

## 2020-11-23 DIAGNOSIS — R350 Frequency of micturition: Secondary | ICD-10-CM

## 2020-11-23 NOTE — Progress Notes (Signed)
Pt reports having mucousy blood tinged vaginal discharge with a bleach - like odor for 1.5 weeks. She denies vaginal irritation and itching. Pt also reports frequency of urination however denies burning or pain w/urination. She is not sexually active. Self swab and urine specimen were obtained and pt was advised that she will be notified of results via Mychart. She voiced understanding.

## 2020-11-24 LAB — CERVICOVAGINAL ANCILLARY ONLY
Bacterial Vaginitis (gardnerella): POSITIVE — AB
Candida Glabrata: NEGATIVE
Candida Vaginitis: NEGATIVE
Comment: NEGATIVE
Comment: NEGATIVE
Comment: NEGATIVE

## 2020-11-24 NOTE — Progress Notes (Signed)
Chart reviewed for nurse visit. Agree with plan of care.   Venora Maples, MD 11/24/20 8:33 AM

## 2020-11-25 ENCOUNTER — Other Ambulatory Visit: Payer: Self-pay | Admitting: Family Medicine

## 2020-11-25 DIAGNOSIS — N760 Acute vaginitis: Secondary | ICD-10-CM

## 2020-11-25 DIAGNOSIS — B9689 Other specified bacterial agents as the cause of diseases classified elsewhere: Secondary | ICD-10-CM

## 2020-11-25 LAB — URINE CULTURE

## 2020-11-25 MED ORDER — METRONIDAZOLE 500 MG PO TABS: 500.0000 mg | ORAL_TABLET | Freq: Two times a day (BID) | ORAL | 0 refills | Status: AC

## 2020-11-25 NOTE — Progress Notes (Signed)
Treatment for BV

## 2021-02-06 IMAGING — DX PORTABLE CHEST - 1 VIEW
1 series · 1 of 1 positions shown · non-contrast
Comparison: 04/26/2017

CLINICAL DATA: Cough. Shortness of breath.

EXAM:
PORTABLE CHEST 1 VIEW

[chest ap]
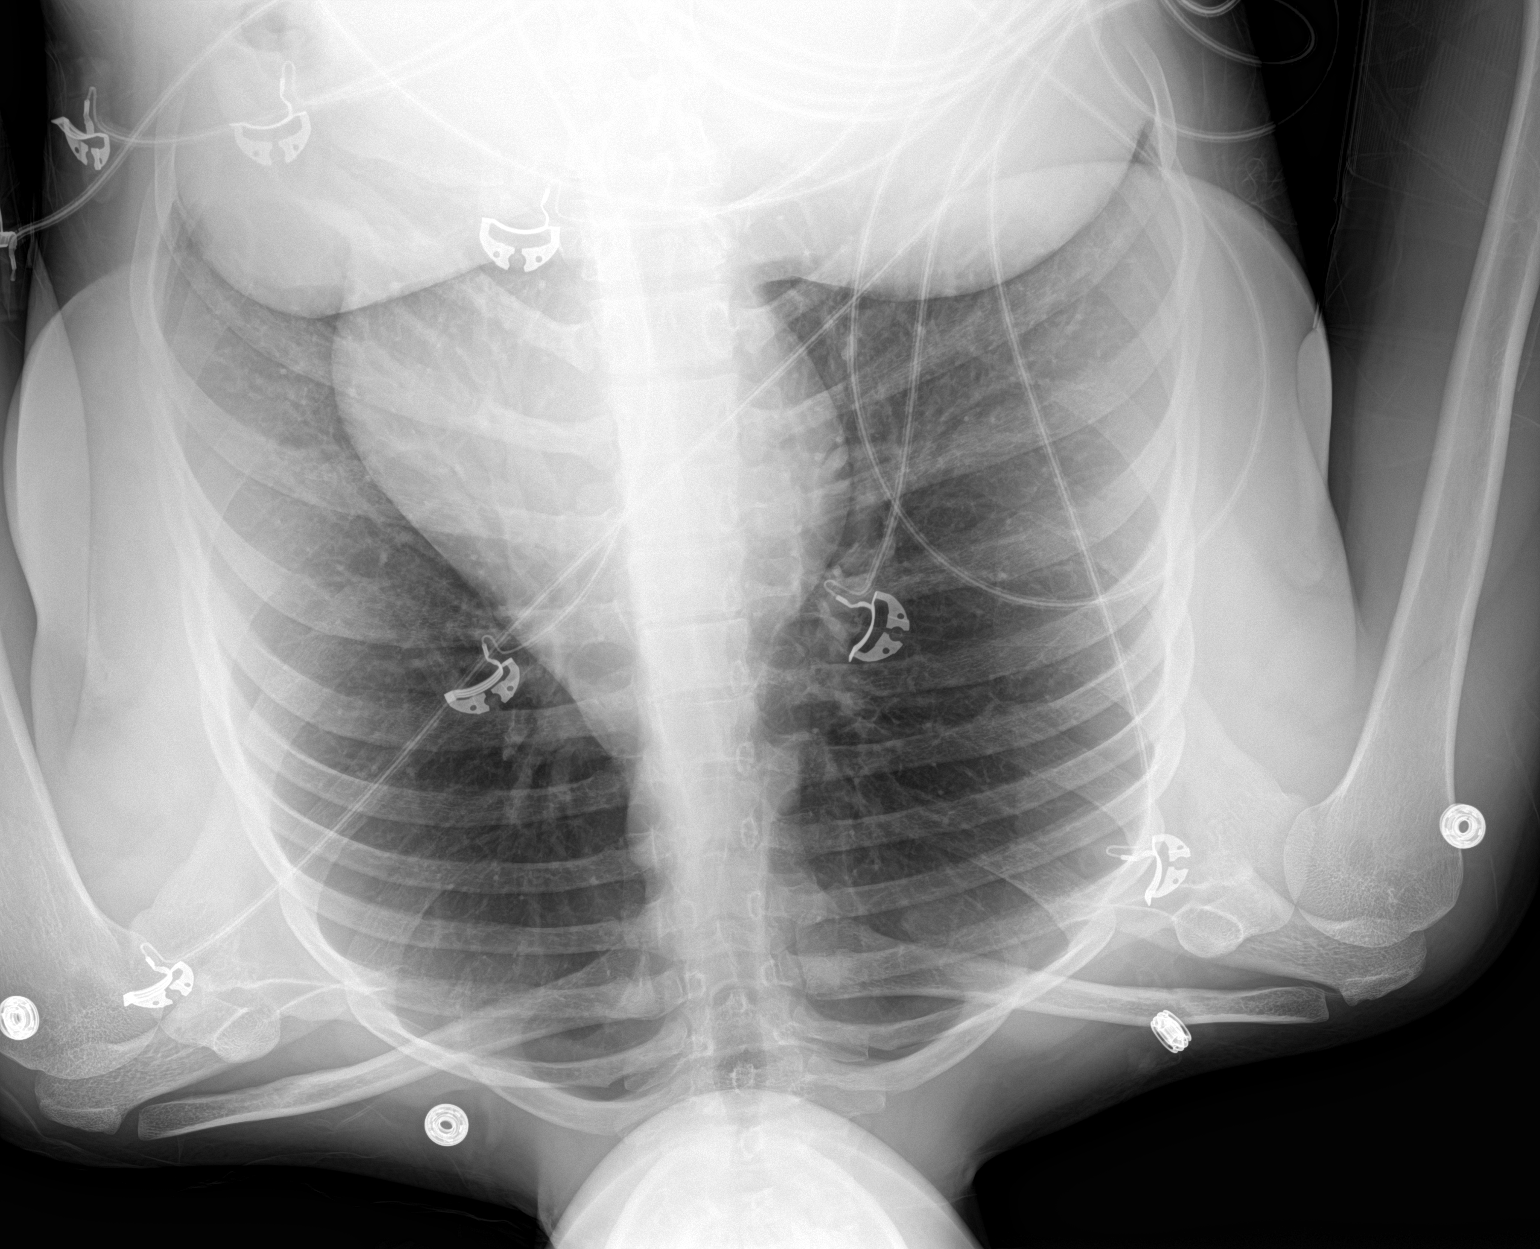

[1 of 1 positions shown; findings below may reference images not displayed]

FINDINGS: The cardiomediastinal contours are normal. The lungs are clear.
Pulmonary vasculature is normal. No consolidation, pleural effusion,
or pneumothorax. No acute osseous abnormalities are seen.
IMPRESSION: Negative AP view of the chest.

## 2021-03-20 ENCOUNTER — Other Ambulatory Visit: Payer: Self-pay

## 2021-03-20 ENCOUNTER — Ambulatory Visit (INDEPENDENT_AMBULATORY_CARE_PROVIDER_SITE_OTHER): Payer: Medicaid Other

## 2021-03-20 DIAGNOSIS — Z3201 Encounter for pregnancy test, result positive: Secondary | ICD-10-CM | POA: Diagnosis not present

## 2021-03-20 DIAGNOSIS — Z3200 Encounter for pregnancy test, result unknown: Secondary | ICD-10-CM

## 2021-03-20 LAB — POCT PREGNANCY, URINE: Preg Test, Ur: POSITIVE — AB

## 2021-03-20 NOTE — Progress Notes (Signed)
Chart reviewed for nurse visit. Agree with plan of care.   Marylene Land, CNM 03/20/2021 5:21 PM

## 2021-03-20 NOTE — Progress Notes (Signed)
Pt left urine in office for UPT; result is positive. Called pt; VM left. MyChart message sent.

## 2021-04-14 ENCOUNTER — Telehealth: Payer: Self-pay

## 2021-04-14 MED ORDER — METRONIDAZOLE 500 MG PO TABS
500.0000 mg | ORAL_TABLET | Freq: Two times a day (BID) | ORAL | 0 refills | Status: DC
Start: 2021-04-14 — End: 2021-05-04

## 2021-04-14 NOTE — Telephone Encounter (Signed)
Pt called transferred from the front desk.  Pt wanted to know if PCN was safe to take in pregnancy.  Pt reports that her PCP prescribed it for BV. Reviewed medication with Dr. Crissie Reese.   I informed pt that PCN is safe to take in pregnancy however we recommend taking Flagyl to treat BV.  I encouraged pt to contact our office with her concerns while pregnant and that she can discontinue using the PCN and take the Flagyl to treat the BV.  Pt verbalized understanding with no further questions.   Addison Naegeli, RN  04/14/21

## 2021-05-02 ENCOUNTER — Ambulatory Visit (INDEPENDENT_AMBULATORY_CARE_PROVIDER_SITE_OTHER): Payer: Medicaid Other | Admitting: Obstetrics and Gynecology

## 2021-05-02 ENCOUNTER — Encounter: Payer: Self-pay | Admitting: Obstetrics and Gynecology

## 2021-05-02 ENCOUNTER — Other Ambulatory Visit: Payer: Self-pay | Admitting: Obstetrics and Gynecology

## 2021-05-02 ENCOUNTER — Other Ambulatory Visit: Payer: Self-pay

## 2021-05-02 VITALS — BP 123/69 | HR 81 | Wt 177.0 lb

## 2021-05-02 DIAGNOSIS — Z3480 Encounter for supervision of other normal pregnancy, unspecified trimester: Secondary | ICD-10-CM

## 2021-05-02 DIAGNOSIS — O219 Vomiting of pregnancy, unspecified: Secondary | ICD-10-CM

## 2021-05-02 DIAGNOSIS — Z3491 Encounter for supervision of normal pregnancy, unspecified, first trimester: Secondary | ICD-10-CM

## 2021-05-02 DIAGNOSIS — Z3A11 11 weeks gestation of pregnancy: Secondary | ICD-10-CM | POA: Diagnosis not present

## 2021-05-02 DIAGNOSIS — O99210 Obesity complicating pregnancy, unspecified trimester: Secondary | ICD-10-CM | POA: Diagnosis not present

## 2021-05-02 DIAGNOSIS — Z8759 Personal history of other complications of pregnancy, childbirth and the puerperium: Secondary | ICD-10-CM

## 2021-05-02 MED ORDER — DOXYLAMINE-PYRIDOXINE 10-10 MG PO TBEC: 2.0000 | DELAYED_RELEASE_TABLET | Freq: Two times a day (BID) | ORAL | 1 refills | Status: DC | PRN

## 2021-05-02 MED ORDER — BLOOD PRESSURE KIT DEVI: 1.0000 | 0 refills | Status: DC

## 2021-05-02 MED ORDER — ASPIRIN EC 81 MG PO TBEC: 81.0000 mg | DELAYED_RELEASE_TABLET | Freq: Every day | ORAL | 3 refills | Status: DC

## 2021-05-02 NOTE — Patient Instructions (Signed)
-  Start baby aspirin 81mg  daily to prevent preeclampsia. Continue until delivery.

## 2021-05-02 NOTE — Progress Notes (Signed)
   PRENATAL VISIT NOTE  Subjective:  Rhonda Sparks is a 20 y.o. G2P1001 at [redacted]w[redacted]d being seen today for ongoing prenatal care.  She is currently monitored for the following issues for this low-risk pregnancy and has New daily persistent headache; Chronic tension type headache; Spondylolysis; Normal labor; Supervision of other normal pregnancy, antepartum; Obesity in pregnancy; and [redacted] weeks gestation of pregnancy on their problem list.  Patient reports no complaints.  Contractions: Not present. Vag. Bleeding: None.  Movement: Absent. Denies leaking of fluid.   The following portions of the patient's history were reviewed and updated as appropriate: allergies, current medications, past family history, past medical history, past social history, past surgical history and problem list.   Objective:   Vitals:   05/02/21 1342  BP: 123/69  Pulse: 81  Weight: 177 lb (80.3 kg)    Fetal Status: Fetal Heart Rate (bpm): 159   Movement: Absent     General:  Alert, oriented and cooperative. Patient is in no acute distress.  Skin: Skin is warm and dry. No rash noted.   Cardiovascular: Normal heart rate noted  Respiratory: Normal respiratory effort, no problems with respiration noted  Abdomen: Soft, gravid, appropriate for gestational age.  Pain/Pressure: Absent     Pelvic: Cervical exam deferred        Extremities: Normal range of motion.  Edema: None  Mental Status: Normal mood and affect. Normal behavior. Normal judgment and thought content.   Assessment and Plan:  Pregnancy: G2P1001 at [redacted]w[redacted]d by certain LMP.  1. Supervision of other normal pregnancy, antepartum  [redacted] weeks gestation of pregnancy: Desired pregnancy. H/o uncomplicated vaginal delivery x1. Pt currently living with her grandparents. Denies tobacco or substance use. Denies history of HSV or other recent STIs. No mood or safety concerns. Currently living with her grandparents. Plans to breastfeed. Considering inpatient Nexplanon.  -  continue prenatal vitamin; also start ASA daily as noted below - RTC in 4 weeks for f/u prenatal appt  2. History of gestational hypertension: diagnosed s/p last delivery. - Babyscripts and home blood pressure cuff discussed today - start ASA at 12 weeks given risk factors for preE - baseline preE labs today  3. Obesity in Pregnancy: BMI 33 at new OB. - counseled on benefits of regular exercise and healthy eating - A1c, PreE labs today  4. Chronic tension type headache: No recent flares. - counseled on use of tylenol and magnesium  Preterm labor symptoms and general obstetric precautions including but not limited to vaginal bleeding, contractions, leaking of fluid and fetal movement were reviewed in detail with the patient. Please refer to After Visit Summary for other counseling recommendations.   Return in about 4 weeks (around 05/30/2021) for f/u for next prenatal appt in 4 weeks in person, any provider.  Future Appointments  Date Time Provider Department Center  05/30/2021  2:55 PM Madlyn Frankel [facility]  06/26/2021 10:30 AM [facility] US3 [facility]    Barb Merino Skipper Cliche, MD OB Fellow, Faculty Practice 05/04/2021 3:45 PM

## 2021-05-02 NOTE — Progress Notes (Signed)
Patient complains of constant vomiting along with becoming light headed, lack of appetite and always being "cold"

## 2021-05-03 LAB — CBC/D/PLT+RPR+RH+ABO+RUBIGG...
Antibody Screen: NEGATIVE
Basophils Absolute: 0 10*3/uL (ref 0.0–0.2)
Basos: 0 %
EOS (ABSOLUTE): 0.1 10*3/uL (ref 0.0–0.4)
Eos: 1 %
HCV Ab: <0.1 s/co ratio (ref 0.0–0.9)
HIV Screen 4th Generation wRfx: NONREACTIVE
Hematocrit: 41.4 % (ref 34.0–46.6)
Hemoglobin: 14.3 g/dL (ref 11.1–15.9)
Hepatitis B Surface Ag: NEGATIVE
Immature Grans (Abs): 0 10*3/uL (ref 0.0–0.1)
Immature Granulocytes: 0 %
Lymphocytes Absolute: 1.9 10*3/uL (ref 0.7–3.1)
Lymphs: 23 %
MCH: 31.8 pg (ref 26.6–33.0)
MCHC: 34.5 g/dL (ref 31.5–35.7)
MCV: 92 fL (ref 79–97)
Monocytes Absolute: 0.4 10*3/uL (ref 0.1–0.9)
Monocytes: 5 %
Neutrophils Absolute: 6 10*3/uL (ref 1.4–7.0)
Neutrophils: 71 %
Platelets: 231 10*3/uL (ref 150–450)
RBC: 4.49 x10E6/uL (ref 3.77–5.28)
RDW: 13.2 % (ref 11.7–15.4)
RPR Ser Ql: NONREACTIVE
Rh Factor: POSITIVE
Rubella Antibodies, IGG: 2.5 index (ref >0.99)
WBC: 8.4 10*3/uL (ref 3.4–10.8)

## 2021-05-03 LAB — PROTEIN / CREATININE RATIO, URINE
Creatinine, Urine: 65.4 mg/dL
Protein, Ur: 7.7 mg/dL
Protein/Creat Ratio: 118 mg/g creat (ref 0–200)

## 2021-05-03 LAB — COMPREHENSIVE METABOLIC PANEL
ALT: 12 IU/L (ref 0–32)
AST: 18 IU/L (ref 0–40)
Albumin/Globulin Ratio: 2 (ref 1.2–2.2)
Albumin: 4.7 g/dL (ref 3.9–5.0)
Alkaline Phosphatase: 79 IU/L (ref 42–106)
BUN/Creatinine Ratio: 12 (ref 9–23)
BUN: 7 mg/dL (ref 6–20)
Bilirubin Total: 0.5 mg/dL (ref 0.0–1.2)
CO2: 21 mmol/L (ref 20–29)
Calcium: 9.9 mg/dL (ref 8.7–10.2)
Chloride: 101 mmol/L (ref 96–106)
Creatinine, Ser: 0.59 mg/dL (ref 0.57–1.00)
Globulin, Total: 2.3 g/dL (ref 1.5–4.5)
Glucose: 75 mg/dL (ref 65–99)
Potassium: 3.9 mmol/L (ref 3.5–5.2)
Sodium: 137 mmol/L (ref 134–144)
Total Protein: 7 g/dL (ref 6.0–8.5)
eGFR: 132 mL/min/{1.73_m2} (ref >59)

## 2021-05-03 LAB — HEMOGLOBIN A1C
Est. average glucose Bld gHb Est-mCnc: 103 mg/dL
Hgb A1c MFr Bld: 5.2 % (ref 4.8–5.6)

## 2021-05-03 LAB — HCV INTERPRETATION

## 2021-05-04 DIAGNOSIS — Z8759 Personal history of other complications of pregnancy, childbirth and the puerperium: Secondary | ICD-10-CM | POA: Insufficient documentation

## 2021-05-04 LAB — CULTURE, OB URINE

## 2021-05-04 LAB — URINE CULTURE, OB REFLEX

## 2021-05-05 ENCOUNTER — Encounter: Payer: Self-pay | Admitting: *Deleted

## 2021-05-11 ENCOUNTER — Telehealth: Payer: Self-pay | Admitting: *Deleted

## 2021-05-11 NOTE — Telephone Encounter (Signed)
Received a voice mail from [place] re: possible duplicate tests. Case #[id number]. I called [facility] and they confirmed she has Pharmacologist ordered but has previously had H14 completed. Per chart review I confirmed had H14 done 12/01/2019. I confirmed we do not want H Basic done and to cancel it. Talley Kreiser,RN

## 2021-05-19 ENCOUNTER — Encounter: Payer: Self-pay | Admitting: *Deleted

## 2021-05-30 ENCOUNTER — Encounter: Payer: Self-pay | Admitting: Family Medicine

## 2021-05-30 ENCOUNTER — Ambulatory Visit (INDEPENDENT_AMBULATORY_CARE_PROVIDER_SITE_OTHER): Payer: Medicaid Other | Admitting: Student

## 2021-05-30 ENCOUNTER — Other Ambulatory Visit: Payer: Self-pay

## 2021-05-30 VITALS — BP 132/78 | HR 96 | Wt 177.4 lb

## 2021-05-30 DIAGNOSIS — Z3480 Encounter for supervision of other normal pregnancy, unspecified trimester: Secondary | ICD-10-CM

## 2021-05-30 DIAGNOSIS — Z3A15 15 weeks gestation of pregnancy: Secondary | ICD-10-CM

## 2021-05-30 NOTE — Progress Notes (Signed)
   PRENATAL VISIT NOTE  Subjective:  Rhonda Sparks is a 20 y.o. G2P1001 at [redacted]w[redacted]d being seen today for ongoing prenatal care.  She is currently monitored for the following issues for this low-risk pregnancy and has New daily persistent headache; Chronic tension type headache; Spondylolysis; Normal labor; Supervision of other normal pregnancy, antepartum; Obesity in pregnancy; [redacted] weeks gestation of pregnancy; and History of gestational hypertension on their problem list.  Patient reports  concern about weight gain .  She is worried that she is not gaining enough weight. She reports eating a normal diet; vomiting is resolving.  Her nausea medicine is working.  Contractions: Not present. Vag. Bleeding: None.  Movement: Absent. Denies leaking of fluid.   The following portions of the patient's history were reviewed and updated as appropriate: allergies, current medications, past family history, past medical history, past social history, past surgical history and problem list.   Objective:   Vitals:   05/30/21 1523  BP: 132/78  Pulse: 96  Weight: 177 lb 6.4 oz (80.5 kg)    Fetal Status: Fetal Heart Rate (bpm): 153   Movement: Absent     General:  Alert, oriented and cooperative. Patient is in no acute distress.  Skin: Skin is warm and dry. No rash noted.   Cardiovascular: Normal heart rate noted  Respiratory: Normal respiratory effort, no problems with respiration noted  Abdomen: Soft, gravid, appropriate for gestational age.  Pain/Pressure: Absent     Pelvic: Cervical exam deferred        Extremities: Normal range of motion.  Edema: None  Mental Status: Normal mood and affect. Normal behavior. Normal judgment and thought content.   Assessment and Plan:  Pregnancy: G2P1001 at [redacted]w[redacted]d 1. Supervision of other normal pregnancy, antepartum -will start baby ASA -doing well, reassured about weight gain -will do AFP today  Preterm labor symptoms and general obstetric precautions including  but not limited to vaginal bleeding, contractions, leaking of fluid and fetal movement were reviewed in detail with the patient. Please refer to After Visit Summary for other counseling recommendations.   Return in about 4 weeks (around 06/27/2021), or LROB with KK (or other APP).  Future Appointments  Date Time Provider Department Center  06/26/2021 10:30 AM [facility] US[facility]    Marylene Land, CNM

## 2021-05-30 NOTE — Progress Notes (Signed)
Pt is concerned that she is loosing weight.

## 2021-06-01 LAB — AFP, SERUM, OPEN SPINA BIFIDA
AFP MoM: 0.73
AFP Value: 20.9 ng/mL
Gest. Age on Collection Date: 15.3 weeks
Maternal Age At EDD: 21.4 yr
OSBR Risk 1 IN: 10000
Test Results:: NEGATIVE
Weight: 177 [lb_av]

## 2021-06-20 ENCOUNTER — Other Ambulatory Visit: Payer: Self-pay

## 2021-06-20 ENCOUNTER — Ambulatory Visit (INDEPENDENT_AMBULATORY_CARE_PROVIDER_SITE_OTHER): Payer: Medicaid Other

## 2021-06-20 ENCOUNTER — Other Ambulatory Visit (HOSPITAL_COMMUNITY)
Admission: RE | Admit: 2021-06-20 | Discharge: 2021-06-20 | Disposition: A | Discharge status: Home / Self Care | Payer: Medicaid Other | Source: Ambulatory Visit | Attending: Family Medicine | Admitting: Family Medicine

## 2021-06-20 VITALS — BP 103/50 | HR 84 | Ht 61.0 in | Wt 178.7 lb

## 2021-06-20 DIAGNOSIS — N898 Other specified noninflammatory disorders of vagina: Secondary | ICD-10-CM | POA: Insufficient documentation

## 2021-06-20 DIAGNOSIS — Z3480 Encounter for supervision of other normal pregnancy, unspecified trimester: Secondary | ICD-10-CM

## 2021-06-20 DIAGNOSIS — R350 Frequency of micturition: Secondary | ICD-10-CM

## 2021-06-20 LAB — POCT URINALYSIS DIP (DEVICE)
Bilirubin Urine: NEGATIVE
Glucose, UA: NEGATIVE mg/dL
Hgb urine dipstick: NEGATIVE
Ketones, ur: NEGATIVE mg/dL
Leukocytes,Ua: NEGATIVE
Nitrite: NEGATIVE
Protein, ur: NEGATIVE mg/dL
Specific Gravity, Urine: 1.02 (ref 1.005–1.030)
Urobilinogen, UA: 0.2 mg/dL (ref 0.0–1.0)
pH: 7 (ref 5.0–8.0)

## 2021-06-20 MED ORDER — PRENATAL PLUS 27-1 MG PO TABS: 1.0000 | ORAL_TABLET | Freq: Every day | ORAL | 6 refills | Status: DC

## 2021-06-20 NOTE — Progress Notes (Addendum)
Pt states here today for vaginal irritation, itching  and odor, denies vaginal discharge, abd pain or cramps for past week. Pt has not used OTC medications. Also states having possible UTI with frequency. Will run UA today.  Pt is currently pregnant at [redacted]w[redacted]d. +FM. Has ROB and [place] scheduled for 06/26/21. Pt aware.   Pt needing PNV Rx refill. Rx sent to pharmacy on file.   UA today: negative, but will it send UC today.   Self swab collected today. Pt advised results will take 24-48 hours and will see results in mychart and will be notified if needs further treatment. Pt verbalized understanding and agreeable to plan of care.   Judeth Cornfield, RN    Chart reviewed for nurse visit. Agree with plan of care.   Currie Paris, NP 06/20/2021 7:57 PM

## 2021-06-21 ENCOUNTER — Other Ambulatory Visit: Payer: Self-pay | Admitting: Nurse Practitioner

## 2021-06-21 LAB — CERVICOVAGINAL ANCILLARY ONLY
Bacterial Vaginitis (gardnerella): NEGATIVE
Candida Glabrata: NEGATIVE
Candida Vaginitis: POSITIVE — AB
Chlamydia: NEGATIVE
Comment: NEGATIVE
Comment: NEGATIVE
Comment: NEGATIVE
Comment: NEGATIVE
Comment: NEGATIVE
Comment: NORMAL
Neisseria Gonorrhea: NEGATIVE
Trichomonas: NEGATIVE

## 2021-06-21 MED ORDER — TERCONAZOLE 0.4 % VA CREA: 1.0000 | TOPICAL_CREAM | Freq: Every day | VAGINAL | 1 refills | Status: AC

## 2021-06-23 ENCOUNTER — Encounter (HOSPITAL_COMMUNITY): Payer: Self-pay | Admitting: Obstetrics and Gynecology

## 2021-06-23 ENCOUNTER — Inpatient Hospital Stay (HOSPITAL_COMMUNITY)
Admission: AD | Admit: 2021-06-23 | Discharge: 2021-06-23 | Disposition: A | Discharge status: Home / Self Care | Payer: Medicaid Other | Attending: Obstetrics and Gynecology | Admitting: Obstetrics and Gynecology

## 2021-06-23 DIAGNOSIS — O98812 Other maternal infectious and parasitic diseases complicating pregnancy, second trimester: Secondary | ICD-10-CM | POA: Diagnosis not present

## 2021-06-23 DIAGNOSIS — Z3A18 18 weeks gestation of pregnancy: Secondary | ICD-10-CM | POA: Insufficient documentation

## 2021-06-23 DIAGNOSIS — B373 Candidiasis of vulva and vagina: Secondary | ICD-10-CM | POA: Diagnosis not present

## 2021-06-23 DIAGNOSIS — Z79899 Other long term (current) drug therapy: Secondary | ICD-10-CM | POA: Insufficient documentation

## 2021-06-23 DIAGNOSIS — B3731 Acute candidiasis of vulva and vagina: Secondary | ICD-10-CM

## 2021-06-23 DIAGNOSIS — Z0371 Encounter for suspected problem with amniotic cavity and membrane ruled out: Secondary | ICD-10-CM

## 2021-06-23 DIAGNOSIS — Z87891 Personal history of nicotine dependence: Secondary | ICD-10-CM | POA: Insufficient documentation

## 2021-06-23 LAB — URINALYSIS, ROUTINE W REFLEX MICROSCOPIC
Bilirubin Urine: NEGATIVE
Glucose, UA: NEGATIVE mg/dL
Hgb urine dipstick: NEGATIVE
Ketones, ur: NEGATIVE mg/dL
Nitrite: NEGATIVE
Protein, ur: NEGATIVE mg/dL
Specific Gravity, Urine: 1.027 (ref 1.005–1.030)
pH: 8 (ref 5.0–8.0)

## 2021-06-23 LAB — AMNISURE RUPTURE OF MEMBRANE (ROM) NOT AT ARMC: Amnisure ROM: NEGATIVE

## 2021-06-23 LAB — POCT FERN TEST: POCT Fern Test: NEGATIVE
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
## 2021-06-23 NOTE — Discharge Instructions (Signed)
Please make sure you take the Terazol for your vaginal discharge which will hopefully improve some your symptoms.  Through your testing today, it is very unlikely that you have broken your water which is very reassuring.  If you have any further gush of fluid or continued leaking soaking through your underwear please return to the MAU.  Additionally if you have any severe abdominal pain or vaginal bleeding please return to the MAU.  Make sure you keep your appointments on 8/8 for your prenatal and ultrasound.

## 2021-06-23 NOTE — MAU Note (Signed)
..  Rhonda Sparks is a 20 y.o. at [redacted]w[redacted]d here in MAU reporting: Around 7:20 pm she got out the car saw the seat was wet and felt a gush of clear fluid and has continued to leak since. +FM. Denies cramping or contractions.  Pain score: 0/10 Vitals:   06/23/21 2005  BP: 121/68  Resp: 18  Temp: 98.3 F (36.8 C)  SpO2: 100%     FHT: 145 Lab orders placed from triage: UA

## 2021-06-24 LAB — URINE CULTURE, OB REFLEX

## 2021-06-24 LAB — CULTURE, OB URINE

## 2021-06-25 ENCOUNTER — Other Ambulatory Visit: Payer: Self-pay | Admitting: Nurse Practitioner

## 2021-06-25 ENCOUNTER — Encounter: Payer: Self-pay | Admitting: Nurse Practitioner

## 2021-06-25 DIAGNOSIS — R8271 Bacteriuria: Secondary | ICD-10-CM | POA: Insufficient documentation

## 2021-06-25 DIAGNOSIS — B951 Streptococcus, group B, as the cause of diseases classified elsewhere: Secondary | ICD-10-CM | POA: Insufficient documentation

## 2021-06-25 MED ORDER — AMOXICILLIN 500 MG PO TABS: 500.0000 mg | ORAL_TABLET | Freq: Three times a day (TID) | ORAL | 0 refills | Status: AC

## 2021-06-26 ENCOUNTER — Other Ambulatory Visit: Payer: Self-pay | Admitting: *Deleted

## 2021-06-26 ENCOUNTER — Other Ambulatory Visit: Payer: Self-pay

## 2021-06-26 ENCOUNTER — Other Ambulatory Visit: Payer: Self-pay | Admitting: Family Medicine

## 2021-06-26 ENCOUNTER — Ambulatory Visit (INDEPENDENT_AMBULATORY_CARE_PROVIDER_SITE_OTHER): Payer: Medicaid Other | Admitting: Student

## 2021-06-26 ENCOUNTER — Ambulatory Visit: Payer: Medicaid Other | Attending: Obstetrics and Gynecology

## 2021-06-26 ENCOUNTER — Other Ambulatory Visit: Payer: Self-pay | Admitting: Obstetrics and Gynecology

## 2021-06-26 VITALS — BP 112/72 | HR 79 | Wt 180.9 lb

## 2021-06-26 DIAGNOSIS — Z8632 Personal history of gestational diabetes: Secondary | ICD-10-CM

## 2021-06-26 DIAGNOSIS — Z363 Encounter for antenatal screening for malformations: Secondary | ICD-10-CM | POA: Diagnosis not present

## 2021-06-26 DIAGNOSIS — O358XX0 Maternal care for other (suspected) fetal abnormality and damage, not applicable or unspecified: Secondary | ICD-10-CM

## 2021-06-26 DIAGNOSIS — Z3480 Encounter for supervision of other normal pregnancy, unspecified trimester: Secondary | ICD-10-CM

## 2021-06-26 DIAGNOSIS — Z3A19 19 weeks gestation of pregnancy: Secondary | ICD-10-CM

## 2021-06-26 DIAGNOSIS — E669 Obesity, unspecified: Secondary | ICD-10-CM | POA: Diagnosis not present

## 2021-06-26 DIAGNOSIS — Z362 Encounter for other antenatal screening follow-up: Secondary | ICD-10-CM

## 2021-06-26 DIAGNOSIS — O99212 Obesity complicating pregnancy, second trimester: Secondary | ICD-10-CM | POA: Diagnosis not present

## 2021-06-26 DIAGNOSIS — O09892 Supervision of other high risk pregnancies, second trimester: Secondary | ICD-10-CM

## 2021-06-26 NOTE — Progress Notes (Signed)
   PRENATAL VISIT NOTE  Subjective:  Rhonda Sparks is a 20 y.o. G2P1001 at [redacted]w[redacted]d being seen today for ongoing prenatal care.  She is currently monitored for the following issues for this low-risk pregnancy and has New daily persistent headache; Chronic tension type headache; Spondylolysis; Normal labor; Supervision of other normal pregnancy, antepartum; Obesity in pregnancy; [redacted] weeks gestation of pregnancy; History of gestational hypertension; and GBS (group b Streptococcus) UTI complicating pregnancy, second trimester on their problem list.  Patient reports no complaints.  Contractions: Not present. Vag. Bleeding: None.  Movement: Present. Denies leaking of fluid.   The following portions of the patient's history were reviewed and updated as appropriate: allergies, current medications, past family history, past medical history, past social history, past surgical history and problem list.   Objective:   Vitals:   06/26/21 1335  BP: 112/72  Pulse: 79  Weight: 180 lb 14.4 oz (82.1 kg)    Fetal Status: Fetal Heart Rate (bpm): 152   Movement: Present     General:  Alert, oriented and cooperative. Patient is in no acute distress.  Skin: Skin is warm and dry. No rash noted.   Cardiovascular: Normal heart rate noted  Respiratory: Normal respiratory effort, no problems with respiration noted  Abdomen: Soft, gravid, appropriate for gestational age.  Pain/Pressure: Absent     Pelvic: Cervical exam deferred        Extremities: Normal range of motion.  Edema: Trace  Mental Status: Normal mood and affect. Normal behavior. Normal judgment and thought content.   Assessment and Plan:  Pregnancy: G2P1001 at [redacted]w[redacted]d  1. [redacted] weeks gestation of pregnancy   -reviewed US findings and discussed implications of IECF, patient is reassured -Bp normal today -patient is still taking baby ASA  Preterm labor symptoms and general obstetric precautions including but not limited to vaginal bleeding,  contractions, leaking of fluid and fetal movement were reviewed in detail with the patient. Please refer to After Visit Summary for other counseling recommendations.   Return in about 4 weeks (around 07/24/2021), or LROB.  Future Appointments  Date Time Provider Department Center  07/25/2021  2:45 PM [facility] NURSE [facility]  07/25/2021  3:00 PM [facility] US1 [facility]    Marylene Land, CNM

## 2021-07-25 ENCOUNTER — Ambulatory Visit (INDEPENDENT_AMBULATORY_CARE_PROVIDER_SITE_OTHER): Payer: Medicaid Other | Admitting: Nurse Practitioner

## 2021-07-25 ENCOUNTER — Encounter: Payer: Self-pay | Admitting: *Deleted

## 2021-07-25 ENCOUNTER — Ambulatory Visit: Payer: Medicaid Other | Attending: Obstetrics and Gynecology

## 2021-07-25 ENCOUNTER — Other Ambulatory Visit: Payer: Self-pay | Admitting: *Deleted

## 2021-07-25 ENCOUNTER — Other Ambulatory Visit: Payer: Self-pay | Admitting: Obstetrics and Gynecology

## 2021-07-25 ENCOUNTER — Other Ambulatory Visit: Payer: Self-pay

## 2021-07-25 ENCOUNTER — Ambulatory Visit: Payer: Medicaid Other | Admitting: *Deleted

## 2021-07-25 VITALS — BP 129/72 | HR 85 | Wt 182.0 lb

## 2021-07-25 VITALS — BP 115/72 | HR 76

## 2021-07-25 DIAGNOSIS — O09892 Supervision of other high risk pregnancies, second trimester: Secondary | ICD-10-CM | POA: Diagnosis present

## 2021-07-25 DIAGNOSIS — Z362 Encounter for other antenatal screening follow-up: Secondary | ICD-10-CM | POA: Insufficient documentation

## 2021-07-25 DIAGNOSIS — O09299 Supervision of pregnancy with other poor reproductive or obstetric history, unspecified trimester: Secondary | ICD-10-CM | POA: Diagnosis present

## 2021-07-25 DIAGNOSIS — O09292 Supervision of pregnancy with other poor reproductive or obstetric history, second trimester: Secondary | ICD-10-CM | POA: Diagnosis not present

## 2021-07-25 DIAGNOSIS — Z3A23 23 weeks gestation of pregnancy: Secondary | ICD-10-CM

## 2021-07-25 DIAGNOSIS — O99212 Obesity complicating pregnancy, second trimester: Secondary | ICD-10-CM | POA: Insufficient documentation

## 2021-07-25 DIAGNOSIS — O09899 Supervision of other high risk pregnancies, unspecified trimester: Secondary | ICD-10-CM

## 2021-07-25 DIAGNOSIS — Z3480 Encounter for supervision of other normal pregnancy, unspecified trimester: Secondary | ICD-10-CM | POA: Insufficient documentation

## 2021-07-25 DIAGNOSIS — O99210 Obesity complicating pregnancy, unspecified trimester: Secondary | ICD-10-CM

## 2021-07-25 DIAGNOSIS — O09893 Supervision of other high risk pregnancies, third trimester: Secondary | ICD-10-CM

## 2021-07-25 DIAGNOSIS — G44229 Chronic tension-type headache, not intractable: Secondary | ICD-10-CM

## 2021-07-25 NOTE — Progress Notes (Signed)
    Subjective:  Rhonda Sparks is a 20 y.o. G2P1001 at [redacted]w[redacted]d being seen today for ongoing prenatal care.  She is currently monitored for the following issues for this low-risk pregnancy and has New daily persistent headache; Chronic tension type headache; Spondylolysis; Supervision of other normal pregnancy, antepartum; Obesity in pregnancy; History of gestational hypertension; and GBS (group b Streptococcus) UTI complicating pregnancy, second trimester on their problem list.  Patient reports  periodic headaches continuing .  Contractions: Not present. Vag. Bleeding: None.  Movement: Present. Denies leaking of fluid.   The following portions of the patient's history were reviewed and updated as appropriate: allergies, current medications, past family history, past medical history, past social history, past surgical history and problem list. Problem list updated.  Objective:   Vitals:   07/25/21 1338  BP: 129/72  Pulse: 85  Weight: 182 lb (82.6 kg)    Fetal Status: Fetal Heart Rate (bpm): 147 Fundal Height: 25 cm Movement: Present     General:  Alert, oriented and cooperative. Patient is in no acute distress.  Skin: Skin is warm and dry. No rash noted.   Cardiovascular: Normal heart rate noted  Respiratory: Normal respiratory effort, no problems with respiration noted  Abdomen: Soft, gravid, appropriate for gestational age. Pain/Pressure: Absent     Pelvic:  Cervical exam deferred        Extremities: Normal range of motion.  Edema: None  Mental Status: Normal mood and affect. Normal behavior. Normal judgment and thought content.   Urinalysis:      Assessment and Plan:  Pregnancy: G2P1001 at [redacted]w[redacted]d  1. Supervision of other normal pregnancy, antepartum Reminded to take baby aspirin daily - currently taking about 3 times a week when she has a headache Discussed nothing to eat after midnight for next appointment.   Reviewed TDAP due next visit to prevent whooping cough - is aware  - did not take in last pregnancy Reviewed pap is now due and client declines until postpartum visit Partner and 65 month old toddler accompany her today.  2. Obesity in pregnancy Worried she was losing weight but actually weigh has slowly increased  3. Chronic tension-type headache, not intractable Headaches not as severe but continuing about 3 times a week Admits to skipping meals Reviewed triggers for headaches - advised to eat every 3-4 hours and include protein every time you eat - reviewed sources of protein Drink at least 8 8-oz glasses of water every day.   Preterm labor symptoms and general obstetric precautions including but not limited to vaginal bleeding, contractions, leaking of fluid and fetal movement were reviewed in detail with the patient. Please refer to After Visit Summary for other counseling recommendations.  Return in about 4 weeks (around 08/22/2021) for early am appt for glucola and ROB.  Nolene Bernheim, RN, MSN, NP-BC Nurse Practitioner, [facility] for [employer], [facility] 07/25/2021 2:21 PM

## 2021-07-25 NOTE — Progress Notes (Signed)
Patient stated that she only takes baby Asprin when she has a headache

## 2021-08-11 ENCOUNTER — Other Ambulatory Visit: Payer: Self-pay

## 2021-08-11 ENCOUNTER — Other Ambulatory Visit (HOSPITAL_COMMUNITY)
Admission: RE | Admit: 2021-08-11 | Discharge: 2021-08-11 | Disposition: A | Discharge status: Home / Self Care | Payer: Medicaid Other | Source: Ambulatory Visit | Attending: Family Medicine | Admitting: Family Medicine

## 2021-08-11 ENCOUNTER — Ambulatory Visit (INDEPENDENT_AMBULATORY_CARE_PROVIDER_SITE_OTHER): Payer: Medicaid Other

## 2021-08-11 VITALS — BP 120/58 | HR 85

## 2021-08-11 DIAGNOSIS — N898 Other specified noninflammatory disorders of vagina: Secondary | ICD-10-CM

## 2021-08-11 DIAGNOSIS — Z3480 Encounter for supervision of other normal pregnancy, unspecified trimester: Secondary | ICD-10-CM

## 2021-08-11 DIAGNOSIS — R309 Painful micturition, unspecified: Secondary | ICD-10-CM

## 2021-08-11 DIAGNOSIS — R8290 Unspecified abnormal findings in urine: Secondary | ICD-10-CM

## 2021-08-11 LAB — OB RESULTS CONSOLE GBS: GBS: POSITIVE

## 2021-08-11 LAB — POCT URINALYSIS DIP (DEVICE)
Bilirubin Urine: NEGATIVE
Glucose, UA: NEGATIVE mg/dL
Hgb urine dipstick: NEGATIVE
Ketones, ur: NEGATIVE mg/dL
Nitrite: NEGATIVE
Protein, ur: NEGATIVE mg/dL
Specific Gravity, Urine: 1.025 (ref 1.005–1.030)
Urobilinogen, UA: 0.2 mg/dL (ref 0.0–1.0)
pH: 6.5 (ref 5.0–8.0)

## 2021-08-11 MED ORDER — TERCONAZOLE 0.4 % VA CREA: 1.0000 | TOPICAL_CREAM | Freq: Every day | VAGINAL | 0 refills | Status: DC

## 2021-08-11 NOTE — Progress Notes (Signed)
Here today for vaginal self-swab. Reports itching inside vagina and on labia. Reports the skin of her labia feels very irritated. Reports seeing blood when wiping skin. Denies any possibility this is vaginal bleeding. Denies any bumps or lesions. Describes pressure on clitoris "like a pinch" while urinating; UA completed, shows small leukocytes, sent for urine culture. Describes vaginal itching as sometimes yellow and sometimes creamy white, very thick. Describes the odor as "fishy and bitter." Reports that she usually experiences clear vaginal discharge. Pt requests STD testing.   Ordered Terazol cream per protocol. Instructed pt to apply this inside of vagina and to labia nightly for 7 nights. Instructed pt to wash the area with mild, fragrance free soap or water and to dry completely before applying cream. Pt to sleep in loose pants to avoid further irritation.  Fleet Contras RN 08/14/21

## 2021-08-14 ENCOUNTER — Other Ambulatory Visit: Payer: Self-pay | Admitting: Certified Nurse Midwife

## 2021-08-14 DIAGNOSIS — B9689 Other specified bacterial agents as the cause of diseases classified elsewhere: Secondary | ICD-10-CM

## 2021-08-14 DIAGNOSIS — N760 Acute vaginitis: Secondary | ICD-10-CM

## 2021-08-14 LAB — CERVICOVAGINAL ANCILLARY ONLY
Bacterial Vaginitis (gardnerella): POSITIVE — AB
Candida Glabrata: NEGATIVE
Candida Vaginitis: POSITIVE — AB
Chlamydia: NEGATIVE
Comment: NEGATIVE
Comment: NEGATIVE
Comment: NEGATIVE
Comment: NEGATIVE
Comment: NEGATIVE
Comment: NORMAL
Neisseria Gonorrhea: NEGATIVE
Trichomonas: NEGATIVE

## 2021-08-14 MED ORDER — NUVESSA 1.3 % VA GEL: 1.0000 | Freq: Once | VAGINAL | 0 refills | Status: AC

## 2021-08-14 NOTE — Progress Notes (Signed)
Patient was assessed and managed by nursing staff during this encounter. I have reviewed the chart and agree with the documentation and plan.   Edd Arbour, MSN, CNM, [facility] 08/14/21 9:50 AM

## 2021-08-14 NOTE — Progress Notes (Signed)
Bacterial vaginosis present, treatment sent to pharmacy.

## 2021-08-15 LAB — URINE CULTURE, OB REFLEX

## 2021-08-15 LAB — CULTURE, OB URINE

## 2021-08-16 ENCOUNTER — Encounter: Payer: Self-pay | Admitting: Certified Nurse Midwife

## 2021-08-23 ENCOUNTER — Ambulatory Visit (INDEPENDENT_AMBULATORY_CARE_PROVIDER_SITE_OTHER): Payer: Medicaid Other | Admitting: Certified Nurse Midwife

## 2021-08-23 ENCOUNTER — Other Ambulatory Visit: Payer: Self-pay

## 2021-08-23 ENCOUNTER — Other Ambulatory Visit: Payer: Medicaid Other

## 2021-08-23 ENCOUNTER — Encounter: Payer: Self-pay | Admitting: Family Medicine

## 2021-08-23 VITALS — BP 120/78 | HR 109

## 2021-08-23 DIAGNOSIS — Z23 Encounter for immunization: Secondary | ICD-10-CM

## 2021-08-23 DIAGNOSIS — Z3A27 27 weeks gestation of pregnancy: Secondary | ICD-10-CM

## 2021-08-23 DIAGNOSIS — Z3480 Encounter for supervision of other normal pregnancy, unspecified trimester: Secondary | ICD-10-CM

## 2021-08-23 DIAGNOSIS — O219 Vomiting of pregnancy, unspecified: Secondary | ICD-10-CM

## 2021-08-23 DIAGNOSIS — Z3493 Encounter for supervision of normal pregnancy, unspecified, third trimester: Secondary | ICD-10-CM

## 2021-08-23 NOTE — Progress Notes (Signed)
   PRENATAL VISIT NOTE  Subjective:  Rhonda Sparks is a 20 y.o. G2P1001 at [redacted]w[redacted]d being seen today for ongoing prenatal care.  She is currently monitored for the following issues for this low-risk pregnancy and has New daily persistent headache; Chronic tension type headache; Spondylolysis; Supervision of other normal pregnancy, antepartum; Obesity in pregnancy; History of gestational hypertension; GBS bacteriuria; and Short interval between pregnancies affecting pregnancy, antepartum on their problem list.  Patient reports  vomiting this morning after drinking the glucola but no other complaints .  Contractions: Not present. Vag. Bleeding: None.  Movement: Present. Denies leaking of fluid.   The following portions of the patient's history were reviewed and updated as appropriate: allergies, current medications, past family history, past medical history, past social history, past surgical history and problem list.   Objective:   Vitals:   08/23/21 1007  BP: 120/78  Pulse: (!) 109    Fetal Status: Fetal Heart Rate (bpm): 138 Fundal Height: 28 cm Movement: Present     General:  Alert, oriented and cooperative. Patient is in no acute distress.  Skin: Skin is warm and dry. No rash noted.   Cardiovascular: Normal heart rate noted  Respiratory: Normal respiratory effort, no problems with respiration noted  Abdomen: Soft, gravid, appropriate for gestational age.  Pain/Pressure: Absent     Pelvic: Cervical exam deferred        Extremities: Normal range of motion.     Mental Status: Normal mood and affect. Normal behavior. Normal judgment and thought content.   Assessment and Plan:  Pregnancy: G2P1001 at [redacted]w[redacted]d 1. Supervision of low-risk pregnancy, third trimester - Doing well, feeling regular and vigorous fetal movement  2. [redacted] weeks gestation of pregnancy - Routine OB care including 3rd trimester labs, needs to reschedule GTT  3. Nausea and vomiting during pregnancy - Advised to take  zofran prior to GTT appointment to prevent vomiting again, pt amenable to plan  Preterm labor symptoms and general obstetric precautions including but not limited to vaginal bleeding, contractions, leaking of fluid and fetal movement were reviewed in detail with the patient. Please refer to After Visit Summary for other counseling recommendations.   Return in about 2 weeks (around 09/06/2021) for IN-PERSON, LOB.  Future Appointments  Date Time Provider Department Center  09/11/2021  8:20 AM [facility] LAB [facility]  09/11/2021 10:55 AM Warner Mccreedy, MD [facility]  09/26/2021  2:30 PM [facility] NURSE [facility]  09/26/2021  2:45 PM [facility] US5 [facility]    Bernerd Limbo, CNM

## 2021-08-24 LAB — CBC
Hematocrit: 36.2 % (ref 34.0–46.6)
Hemoglobin: 12.2 g/dL (ref 11.1–15.9)
MCH: 31 pg (ref 26.6–33.0)
MCHC: 33.7 g/dL (ref 31.5–35.7)
MCV: 92 fL (ref 79–97)
Platelets: 200 10*3/uL (ref 150–450)
RBC: 3.94 x10E6/uL (ref 3.77–5.28)
RDW: 12.7 % (ref 11.7–15.4)
WBC: 11 10*3/uL — ABNORMAL HIGH (ref 3.4–10.8)

## 2021-08-24 LAB — HIV ANTIBODY (ROUTINE TESTING W REFLEX): HIV Screen 4th Generation wRfx: NONREACTIVE

## 2021-08-24 LAB — RPR: RPR Ser Ql: NONREACTIVE

## 2021-09-11 ENCOUNTER — Other Ambulatory Visit: Payer: Self-pay

## 2021-09-11 ENCOUNTER — Other Ambulatory Visit: Payer: Medicaid Other

## 2021-09-11 ENCOUNTER — Encounter: Payer: Medicaid Other | Admitting: Family Medicine

## 2021-09-11 DIAGNOSIS — Z3480 Encounter for supervision of other normal pregnancy, unspecified trimester: Secondary | ICD-10-CM

## 2021-09-26 ENCOUNTER — Ambulatory Visit: Payer: Medicaid Other | Attending: Obstetrics and Gynecology

## 2021-09-26 ENCOUNTER — Encounter: Payer: Self-pay | Admitting: *Deleted

## 2021-09-26 ENCOUNTER — Ambulatory Visit: Payer: Medicaid Other | Admitting: *Deleted

## 2021-09-26 ENCOUNTER — Other Ambulatory Visit: Payer: Self-pay

## 2021-09-26 VITALS — BP 118/73 | HR 106

## 2021-09-26 DIAGNOSIS — O09893 Supervision of other high risk pregnancies, third trimester: Secondary | ICD-10-CM | POA: Diagnosis present

## 2021-09-26 DIAGNOSIS — E669 Obesity, unspecified: Secondary | ICD-10-CM

## 2021-09-26 DIAGNOSIS — Z3480 Encounter for supervision of other normal pregnancy, unspecified trimester: Secondary | ICD-10-CM | POA: Insufficient documentation

## 2021-09-26 DIAGNOSIS — Z3A32 32 weeks gestation of pregnancy: Secondary | ICD-10-CM

## 2021-09-26 DIAGNOSIS — O358XX0 Maternal care for other (suspected) fetal abnormality and damage, not applicable or unspecified: Secondary | ICD-10-CM

## 2021-09-26 DIAGNOSIS — O99213 Obesity complicating pregnancy, third trimester: Secondary | ICD-10-CM | POA: Diagnosis not present

## 2021-09-27 ENCOUNTER — Other Ambulatory Visit: Payer: Self-pay | Admitting: *Deleted

## 2021-09-27 DIAGNOSIS — Z3689 Encounter for other specified antenatal screening: Secondary | ICD-10-CM

## 2021-10-10 ENCOUNTER — Other Ambulatory Visit: Payer: Self-pay

## 2021-10-10 ENCOUNTER — Ambulatory Visit (INDEPENDENT_AMBULATORY_CARE_PROVIDER_SITE_OTHER): Payer: Medicaid Other | Admitting: Family Medicine

## 2021-10-10 ENCOUNTER — Encounter: Payer: Self-pay | Admitting: Family Medicine

## 2021-10-10 ENCOUNTER — Other Ambulatory Visit: Payer: Medicaid Other

## 2021-10-10 ENCOUNTER — Other Ambulatory Visit (HOSPITAL_COMMUNITY)
Admission: RE | Admit: 2021-10-10 | Discharge: 2021-10-10 | Disposition: A | Discharge status: Home / Self Care | Payer: Medicaid Other | Source: Ambulatory Visit | Attending: Family Medicine | Admitting: Family Medicine

## 2021-10-10 VITALS — BP 127/83 | HR 107 | Wt 196.5 lb

## 2021-10-10 DIAGNOSIS — Z3A34 34 weeks gestation of pregnancy: Secondary | ICD-10-CM

## 2021-10-10 DIAGNOSIS — R8271 Bacteriuria: Secondary | ICD-10-CM

## 2021-10-10 DIAGNOSIS — N898 Other specified noninflammatory disorders of vagina: Secondary | ICD-10-CM | POA: Diagnosis not present

## 2021-10-10 DIAGNOSIS — Z8759 Personal history of other complications of pregnancy, childbirth and the puerperium: Secondary | ICD-10-CM

## 2021-10-10 DIAGNOSIS — Z3480 Encounter for supervision of other normal pregnancy, unspecified trimester: Secondary | ICD-10-CM

## 2021-10-10 NOTE — Progress Notes (Signed)
Patient reports vaginal odor that she describes as "fishy". Patient was offered to do a "self swab" and she agreed. Instructions were given as to how to properly swab, patient verbalized understanding.  Dawayne Patricia, CNA   10/10/21  8:50am

## 2021-10-10 NOTE — Progress Notes (Signed)
    Subjective:  Rhonda Sparks is a 20 y.o. G2P1001 at [redacted]w[redacted]d being seen today for ongoing prenatal care.  She is currently monitored for the following issues for this low-risk pregnancy and has Spondylolysis; Supervision of other normal pregnancy, antepartum; Obesity in pregnancy; History of gestational hypertension; GBS bacteriuria; and Short interval between pregnancies affecting pregnancy, antepartum on their problem list.  Patient reports  vaginal discharge with change in odor . Ready for her gtt today.  Contractions: Not present. Vag. Bleeding: None.  Movement: Present. Denies leaking of fluid.   The following portions of the patient's history were reviewed and updated as appropriate: allergies, current medications, past family history, past medical history, past social history, past surgical history and problem list.   Objective:   Vitals:   10/10/21 0846  BP: 127/83  Pulse: (!) 107  Weight: 196 lb 8 oz (89.1 kg)    Fetal Status: Fetal Heart Rate (bpm): 135 Fundal Height: 34 cm Movement: Present     General:  Alert, oriented and cooperative. Patient is in no acute distress.  Skin: Skin is warm and dry. No rash noted.   Cardiovascular: Normal heart rate noted  Respiratory: Normal respiratory effort, no problems with respiration noted  Abdomen: Soft, gravid, appropriate for gestational age. Pain/Pressure: Absent     Pelvic:  Cervical exam deferred        Extremities: Normal range of motion.  Edema: None  Mental Status: Normal mood and affect. Normal behavior. Normal judgment and thought content.    Assessment and Plan:  Pregnancy: G2P1001 at 105w3d  1. Supervision of other normal pregnancy, antepartum Doing well, normal fetal movement.   2. [redacted] weeks gestation of pregnancy Gtt collected today.   3. History of gestational hypertension BP WNL today without concerning symptoms. Cont ASA and monitor.  4. GBS bacteriuria PCN in labor.   5. Vaginal odor - Cervicovaginal  ancillary only( [place])  Preterm labor symptoms and general obstetric precautions including but not limited to vaginal bleeding, contractions, leaking of fluid and fetal movement were reviewed in detail with the patient. Please refer to After Visit Summary for other counseling recommendations.   Return in about 2 weeks (around 10/24/2021) for LROB with GTT.   Allayne Stack, DO

## 2021-10-11 ENCOUNTER — Other Ambulatory Visit: Payer: Self-pay | Admitting: Family Medicine

## 2021-10-11 DIAGNOSIS — N760 Acute vaginitis: Secondary | ICD-10-CM

## 2021-10-11 DIAGNOSIS — B9689 Other specified bacterial agents as the cause of diseases classified elsewhere: Secondary | ICD-10-CM

## 2021-10-11 LAB — CERVICOVAGINAL ANCILLARY ONLY
Bacterial Vaginitis (gardnerella): POSITIVE — AB
Candida Glabrata: NEGATIVE
Candida Vaginitis: NEGATIVE
Comment: NEGATIVE
Comment: NEGATIVE
Comment: NEGATIVE

## 2021-10-11 LAB — GLUCOSE TOLERANCE, 2 HOURS W/ 1HR
Glucose, 1 hour: 190 mg/dL — ABNORMAL HIGH (ref 70–179)
Glucose, 2 hour: 151 mg/dL (ref 70–152)
Glucose, Fasting: 127 mg/dL — ABNORMAL HIGH (ref 70–91)

## 2021-10-11 MED ORDER — METRONIDAZOLE 500 MG PO TABS: 500.0000 mg | ORAL_TABLET | Freq: Two times a day (BID) | ORAL | 0 refills | Status: AC

## 2021-10-16 ENCOUNTER — Telehealth: Payer: Self-pay

## 2021-10-16 DIAGNOSIS — O24419 Gestational diabetes mellitus in pregnancy, unspecified control: Secondary | ICD-10-CM

## 2021-10-16 MED ORDER — ACCU-CHEK GUIDE W/DEVICE KIT: 1.0000 | PACK | Freq: Four times a day (QID) | 0 refills | Status: DC

## 2021-10-16 MED ORDER — GLUCOSE BLOOD VI STRP: ORAL_STRIP | 12 refills | Status: DC

## 2021-10-16 MED ORDER — ACCU-CHEK SOFTCLIX LANCETS MISC: 12 refills | Status: DC

## 2021-10-16 NOTE — Telephone Encounter (Signed)
Spoke with pt. Pt given results and recommendations per Dr Ephriam Jenkins. Pt verbalized understanding. Diabetes supplies sent to pharmacy on file. Pt aware to bring to Diabetes Ed appt that is scheduled for 12/6 at 915am.  Judeth Cornfield, RN  Diabetes referral placed.

## 2021-10-16 NOTE — Telephone Encounter (Signed)
-----   Message from Warner Mccreedy, MD sent at 10/14/2021  1:50 AM EST ----- Regarding: Request to call patient regarding abnormal GTT Hi Could someone call this patient and let her know regarding her abnormal GTT and also help her get set up with diabetes testing supplies and an appointment with Marylene Land for diabetes education?  Thanks! Dr. Ephriam Jenkins

## 2021-10-17 NOTE — Addendum Note (Signed)
Addended by: Isabell Jarvis on: 10/17/2021 02:42 PM   Modules accepted: Orders

## 2021-10-24 ENCOUNTER — Encounter: Payer: Self-pay | Admitting: *Deleted

## 2021-10-24 ENCOUNTER — Ambulatory Visit: Payer: Medicaid Other | Admitting: *Deleted

## 2021-10-24 ENCOUNTER — Encounter: Payer: Medicaid Other | Attending: Family Medicine | Admitting: Registered"

## 2021-10-24 ENCOUNTER — Ambulatory Visit: Payer: Medicaid Other | Attending: Maternal & Fetal Medicine

## 2021-10-24 ENCOUNTER — Other Ambulatory Visit: Payer: Self-pay | Admitting: Maternal & Fetal Medicine

## 2021-10-24 ENCOUNTER — Ambulatory Visit: Payer: Medicaid Other | Admitting: Registered"

## 2021-10-24 ENCOUNTER — Other Ambulatory Visit: Payer: Self-pay

## 2021-10-24 VITALS — BP 124/65 | HR 106

## 2021-10-24 DIAGNOSIS — O24410 Gestational diabetes mellitus in pregnancy, diet controlled: Secondary | ICD-10-CM

## 2021-10-24 DIAGNOSIS — O09299 Supervision of pregnancy with other poor reproductive or obstetric history, unspecified trimester: Secondary | ICD-10-CM | POA: Insufficient documentation

## 2021-10-24 DIAGNOSIS — Z3A36 36 weeks gestation of pregnancy: Secondary | ICD-10-CM

## 2021-10-24 DIAGNOSIS — Z3689 Encounter for other specified antenatal screening: Secondary | ICD-10-CM | POA: Diagnosis present

## 2021-10-24 DIAGNOSIS — Z3480 Encounter for supervision of other normal pregnancy, unspecified trimester: Secondary | ICD-10-CM | POA: Diagnosis present

## 2021-10-24 DIAGNOSIS — O24419 Gestational diabetes mellitus in pregnancy, unspecified control: Secondary | ICD-10-CM | POA: Insufficient documentation

## 2021-10-24 DIAGNOSIS — O09893 Supervision of other high risk pregnancies, third trimester: Secondary | ICD-10-CM | POA: Insufficient documentation

## 2021-10-24 DIAGNOSIS — O99213 Obesity complicating pregnancy, third trimester: Secondary | ICD-10-CM | POA: Insufficient documentation

## 2021-10-24 DIAGNOSIS — O09293 Supervision of pregnancy with other poor reproductive or obstetric history, third trimester: Secondary | ICD-10-CM

## 2021-10-24 DIAGNOSIS — O358XX0 Maternal care for other (suspected) fetal abnormality and damage, not applicable or unspecified: Secondary | ICD-10-CM

## 2021-10-24 NOTE — Progress Notes (Signed)
Patient was seen for Gestational Diabetes self-management on 10/24/21  Start time 0927 and End time 1023   Estimated due date: 11/18/21; [redacted]w[redacted]d Clinical: Medications: reveiwed Medical History: no h/o GDM Labs: OGTT 127-190(H)-151, A1c 5.2%   Dietary and Lifestyle History: Patient reports family history of type 2 diabetes, mother and grandmother. Patient c/o fatigue and low blood sugar sxs.  Physical Activity: not assessed Stress: not assessed Sleep: ~8 hrs + naps, but still feels physically tired when waking up  24 hr Recall:  First Meal: usually only tolerates whole grain toast and eggs, orange juice Snack: watermelon Second meal: sweet potato fries, chicken bites, poptart, juicy juice Snack: fruit Third meal: fried chicken, mac & cheese, green beas Snack: none Beverages: 6-8 bottles of water, diet lipton tea, juice (2-3 c)  NUTRITION INTERVENTION  Nutrition education (E-1) on the following topics:   Initial Follow-up  _0  _1  Definition of Gestational Diabetes _2  _3  Why dietary management is important in controlling blood glucose _4  _5  Effects each nutrient has on blood glucose levels _6  _7  Simple carbohydrates vs complex carbohydrates _8  _9  Fluid intake _10  _11  Creating a balanced meal plan _12  _13  Carbohydrate counting  _14  _15  When to check blood glucose levels _16  _17  Proper blood glucose monitoring techniques _18  _19  Effect of stress and stress reduction techniques  _20  _21  Exercise effect on blood glucose levels, appropriate exercise during pregnancy _22  _23  Importance of limiting caffeine and abstaining from alcohol and smoking _24  _25  Medications used for blood sugar control during pregnancy _26  _27  Hypoglycemia and rule of 15 _28  _29  Postpartum self care  Patient already has a meter, but is not testing pre breakfast and 2 hours after each meal.  Patient has checked a few times after feeling weird, seeing black spots, over heating, feeling like she might pass out; 2  readings were 170 mg/dL and 87 mg/dL.   Patient instructed to monitor glucose levels: FBS: 60 - ? 95 mg/dL (some clinics use 90 for cutoff) 1 hour: ? 140 mg/dL 2 hour: ? 120 mg/dL  Patient received handouts: Nutrition Diabetes and Pregnancy Carbohydrate Counting List  Patient will be seen for follow-up as needed.

## 2021-10-25 ENCOUNTER — Other Ambulatory Visit: Payer: Self-pay | Admitting: *Deleted

## 2021-10-25 DIAGNOSIS — O09899 Supervision of other high risk pregnancies, unspecified trimester: Secondary | ICD-10-CM

## 2021-10-25 DIAGNOSIS — O24410 Gestational diabetes mellitus in pregnancy, diet controlled: Secondary | ICD-10-CM

## 2021-10-25 DIAGNOSIS — O283 Abnormal ultrasonic finding on antenatal screening of mother: Secondary | ICD-10-CM

## 2021-10-25 DIAGNOSIS — Z6835 Body mass index (BMI) 35.0-35.9, adult: Secondary | ICD-10-CM

## 2021-10-25 DIAGNOSIS — Z8759 Personal history of other complications of pregnancy, childbirth and the puerperium: Secondary | ICD-10-CM

## 2021-10-27 ENCOUNTER — Other Ambulatory Visit (HOSPITAL_COMMUNITY)
Admission: RE | Admit: 2021-10-27 | Discharge: 2021-10-27 | Disposition: A | Discharge status: Home / Self Care | Payer: Medicaid Other | Source: Ambulatory Visit | Attending: Family | Admitting: Family

## 2021-10-27 ENCOUNTER — Ambulatory Visit (INDEPENDENT_AMBULATORY_CARE_PROVIDER_SITE_OTHER): Payer: Medicaid Other

## 2021-10-27 ENCOUNTER — Other Ambulatory Visit: Payer: Self-pay

## 2021-10-27 VITALS — BP 109/72 | HR 102 | Wt 199.0 lb

## 2021-10-27 DIAGNOSIS — O23593 Infection of other part of genital tract in pregnancy, third trimester: Secondary | ICD-10-CM | POA: Insufficient documentation

## 2021-10-27 DIAGNOSIS — O24419 Gestational diabetes mellitus in pregnancy, unspecified control: Secondary | ICD-10-CM

## 2021-10-27 DIAGNOSIS — Z8759 Personal history of other complications of pregnancy, childbirth and the puerperium: Secondary | ICD-10-CM

## 2021-10-27 DIAGNOSIS — Z3A36 36 weeks gestation of pregnancy: Secondary | ICD-10-CM | POA: Insufficient documentation

## 2021-10-27 DIAGNOSIS — O0990 Supervision of high risk pregnancy, unspecified, unspecified trimester: Secondary | ICD-10-CM

## 2021-10-27 DIAGNOSIS — R8271 Bacteriuria: Secondary | ICD-10-CM | POA: Diagnosis not present

## 2021-10-27 NOTE — Progress Notes (Signed)
   PRENATAL VISIT NOTE  Subjective:  Rhonda Sparks is a 20 y.o. G2P1001 at [redacted]w[redacted]d being seen today for ongoing prenatal care.  She is currently monitored for the following issues for this high-risk pregnancy and has Spondylolysis; Supervision of other normal pregnancy, antepartum; Obesity in pregnancy; History of gestational hypertension; GBS bacteriuria; Short interval between pregnancies affecting pregnancy, antepartum; and Gestational diabetes mellitus (GDM) in third trimester on their problem list.  Patient reports vaginal irritation. Finished BV medication on Tuesday.  Contractions: Not present. Vag. Bleeding: None.  Movement: Present. Denies leaking of fluid.   The following portions of the patient's history were reviewed and updated as appropriate: allergies, current medications, past family history, past medical history, past social history, past surgical history and problem list.   Objective:   Vitals:   10/27/21 1133  BP: 109/72  Pulse: (!) 102  Weight: 199 lb (90.3 kg)    Fetal Status: Fetal Heart Rate (bpm): 130 Fundal Height: 39 cm Movement: Present     General:  Alert, oriented and cooperative. Patient is in no acute distress.  Skin: Skin is warm and dry. No rash noted.   Cardiovascular: Normal heart rate noted  Respiratory: Normal respiratory effort, no problems with respiration noted  Abdomen: Soft, gravid, appropriate for gestational age.  Pain/Pressure: Present     Pelvic: Cervical exam performed in the presence of a chaperone Dilation: Closed Effacement (%): 50 Station: -3  Extremities: Normal range of motion.     Mental Status: Normal mood and affect. Normal behavior. Normal judgment and thought content.   Assessment and Plan:  Pregnancy: G2P1001 at [redacted]w[redacted]d 1. [redacted] weeks gestation of pregnancy - Vaginal irritation, GC/CT collected, add on yeast, BV, trich  - Cervicovaginal ancillary only( [place])  2. Supervision of high risk pregnancy, antepartum -  Routine OB care. Doing well. - Anticipatory guidance for upcoming appointments provided  3. Gestational diabetes mellitus (GDM) in third trimester, gestational diabetes method of control unspecified - Patient did not bring log to appt today. Emphasized importance of bringing - Reports fasting blood sugar was 117 this morning and 2hr PP was 119 last night at bedtime. Reviewed normal blood sugar ranges and patient should aim for fasting < 95 and 2hr PP < 120.  - Patient to bring log to next appt  4. GBS bacteriuria - Treat in labor  5. History of gestational hypertension - BP normotensive today, asymptomatic   Preterm labor symptoms and general obstetric precautions including but not limited to vaginal bleeding, contractions, leaking of fluid and fetal movement were reviewed in detail with the patient. Please refer to After Visit Summary for other counseling recommendations.   Return in about 10 days (around 11/06/2021).  Future Appointments  Date Time Provider Department Center  10/30/2021 10:30 AM [facility] NURSE [facility]  10/30/2021 10:45 AM [facility] US6 [facility]  11/06/2021 11:15 AM [facility] NURSE [facility]  11/06/2021 11:30 AM [facility] US3 [facility]  11/08/2021  9:15 AM Crissie Reese, Mary Sella, MD [facility]    Brand Males, CNM 10/27/21 5:05 PM

## 2021-10-27 NOTE — Progress Notes (Signed)
Patient reports pelvic pressure but denies any pain

## 2021-10-30 ENCOUNTER — Ambulatory Visit: Payer: Medicaid Other

## 2021-10-30 ENCOUNTER — Ambulatory Visit: Payer: Medicaid Other | Attending: Obstetrics and Gynecology

## 2021-10-30 ENCOUNTER — Encounter: Payer: Self-pay | Admitting: Certified Nurse Midwife

## 2021-10-30 LAB — CERVICOVAGINAL ANCILLARY ONLY
Bacterial Vaginitis (gardnerella): NEGATIVE
Candida Glabrata: NEGATIVE
Candida Vaginitis: POSITIVE — AB
Chlamydia: NEGATIVE
Comment: NEGATIVE
Comment: NEGATIVE
Comment: NEGATIVE
Comment: NEGATIVE
Comment: NEGATIVE
Comment: NORMAL
Neisseria Gonorrhea: NEGATIVE
Trichomonas: NEGATIVE

## 2021-10-31 ENCOUNTER — Other Ambulatory Visit: Payer: Self-pay

## 2021-10-31 DIAGNOSIS — B3731 Acute candidiasis of vulva and vagina: Secondary | ICD-10-CM

## 2021-10-31 MED ORDER — TERCONAZOLE 0.4 % VA CREA: 1.0000 | TOPICAL_CREAM | Freq: Every day | VAGINAL | 0 refills | Status: DC

## 2021-11-06 ENCOUNTER — Ambulatory Visit: Payer: Medicaid Other | Admitting: *Deleted

## 2021-11-06 ENCOUNTER — Ambulatory Visit: Payer: Medicaid Other | Attending: Obstetrics and Gynecology

## 2021-11-06 ENCOUNTER — Other Ambulatory Visit: Payer: Self-pay

## 2021-11-06 VITALS — BP 116/67 | HR 86

## 2021-11-06 DIAGNOSIS — O24410 Gestational diabetes mellitus in pregnancy, diet controlled: Secondary | ICD-10-CM

## 2021-11-06 DIAGNOSIS — E669 Obesity, unspecified: Secondary | ICD-10-CM

## 2021-11-06 DIAGNOSIS — Z8759 Personal history of other complications of pregnancy, childbirth and the puerperium: Secondary | ICD-10-CM

## 2021-11-06 DIAGNOSIS — O09893 Supervision of other high risk pregnancies, third trimester: Secondary | ICD-10-CM | POA: Diagnosis not present

## 2021-11-06 DIAGNOSIS — Z3A38 38 weeks gestation of pregnancy: Secondary | ICD-10-CM | POA: Insufficient documentation

## 2021-11-06 DIAGNOSIS — O99213 Obesity complicating pregnancy, third trimester: Secondary | ICD-10-CM | POA: Diagnosis not present

## 2021-11-06 DIAGNOSIS — O09899 Supervision of other high risk pregnancies, unspecified trimester: Secondary | ICD-10-CM | POA: Diagnosis present

## 2021-11-06 DIAGNOSIS — Z3480 Encounter for supervision of other normal pregnancy, unspecified trimester: Secondary | ICD-10-CM | POA: Diagnosis present

## 2021-11-06 DIAGNOSIS — Z6835 Body mass index (BMI) 35.0-35.9, adult: Secondary | ICD-10-CM

## 2021-11-06 DIAGNOSIS — O283 Abnormal ultrasonic finding on antenatal screening of mother: Secondary | ICD-10-CM | POA: Diagnosis present

## 2021-11-06 DIAGNOSIS — O358XX0 Maternal care for other (suspected) fetal abnormality and damage, not applicable or unspecified: Secondary | ICD-10-CM | POA: Insufficient documentation

## 2021-11-06 DIAGNOSIS — O09293 Supervision of pregnancy with other poor reproductive or obstetric history, third trimester: Secondary | ICD-10-CM | POA: Insufficient documentation

## 2021-11-08 ENCOUNTER — Encounter: Payer: Medicaid Other | Admitting: Family Medicine

## 2021-11-12 ENCOUNTER — Encounter (HOSPITAL_COMMUNITY): Payer: Self-pay | Admitting: Obstetrics and Gynecology

## 2021-11-12 ENCOUNTER — Inpatient Hospital Stay (HOSPITAL_COMMUNITY)
Admission: AD | Admit: 2021-11-12 | Discharge: 2021-11-14 | DRG: 807 | Disposition: A | Discharge status: Home / Self Care | Payer: Medicaid Other | Attending: Obstetrics & Gynecology | Admitting: Obstetrics & Gynecology

## 2021-11-12 ENCOUNTER — Other Ambulatory Visit: Payer: Self-pay

## 2021-11-12 DIAGNOSIS — O368130 Decreased fetal movements, third trimester, not applicable or unspecified: Secondary | ICD-10-CM | POA: Diagnosis present

## 2021-11-12 DIAGNOSIS — Z20822 Contact with and (suspected) exposure to covid-19: Secondary | ICD-10-CM | POA: Diagnosis present

## 2021-11-12 DIAGNOSIS — Z3A39 39 weeks gestation of pregnancy: Secondary | ICD-10-CM | POA: Diagnosis not present

## 2021-11-12 DIAGNOSIS — Z87891 Personal history of nicotine dependence: Secondary | ICD-10-CM | POA: Diagnosis not present

## 2021-11-12 DIAGNOSIS — O99824 Streptococcus B carrier state complicating childbirth: Secondary | ICD-10-CM | POA: Diagnosis present

## 2021-11-12 DIAGNOSIS — O24420 Gestational diabetes mellitus in childbirth, diet controlled: Secondary | ICD-10-CM | POA: Diagnosis present

## 2021-11-12 DIAGNOSIS — O99820 Streptococcus B carrier state complicating pregnancy: Secondary | ICD-10-CM | POA: Diagnosis not present

## 2021-11-12 LAB — GLUCOSE, CAPILLARY: Glucose-Capillary: 105 mg/dL — ABNORMAL HIGH (ref 70–99)

## 2021-11-12 LAB — CBC
HCT: 34.8 % — ABNORMAL LOW (ref 36.0–46.0)
Hemoglobin: 11.7 g/dL — ABNORMAL LOW (ref 12.0–15.0)
MCH: 31.1 pg (ref 26.0–34.0)
MCHC: 33.6 g/dL (ref 30.0–36.0)
MCV: 92.6 fL (ref 80.0–100.0)
Platelets: 169 10*3/uL (ref 150–400)
RBC: 3.76 MIL/uL — ABNORMAL LOW (ref 3.87–5.11)
RDW: 14.4 % (ref 11.5–15.5)
WBC: 7.5 10*3/uL (ref 4.0–10.5)
nRBC: 0 % (ref 0.0–0.2)

## 2021-11-12 LAB — RESP PANEL BY RT-PCR (FLU A&B, COVID) ARPGX2
Influenza A by PCR: NEGATIVE
Influenza B by PCR: NEGATIVE
SARS Coronavirus 2 by RT PCR: NEGATIVE

## 2021-11-12 LAB — TYPE AND SCREEN
ABO/RH(D): O POS
Antibody Screen: NEGATIVE

## 2021-11-12 MED ORDER — ONDANSETRON HCL 4 MG/2ML IJ SOLN: 4.0000 mg | Freq: Four times a day (QID) | INTRAMUSCULAR | Status: DC | PRN

## 2021-11-12 MED ORDER — OXYTOCIN-SODIUM CHLORIDE 30-0.9 UT/500ML-% IV SOLN
1.0000 m[IU]/min | INTRAVENOUS | Status: DC
  Administered 2021-11-12: 23:00:00 2 m[IU]/min via INTRAVENOUS

## 2021-11-12 MED ORDER — OXYCODONE-ACETAMINOPHEN 5-325 MG PO TABS: 1.0000 | ORAL_TABLET | ORAL | Status: DC | PRN

## 2021-11-12 MED ORDER — OXYTOCIN BOLUS FROM INFUSION
333.0000 mL | Freq: Once | INTRAVENOUS | Status: AC
  Administered 2021-11-13: 11:00:00 333 mL via INTRAVENOUS

## 2021-11-12 MED ORDER — LACTATED RINGERS IV SOLN: 500.0000 mL | INTRAVENOUS | Status: DC | PRN

## 2021-11-12 MED ORDER — OXYTOCIN-SODIUM CHLORIDE 30-0.9 UT/500ML-% IV SOLN
2.5000 [IU]/h | INTRAVENOUS | Status: DC
  Administered 2021-11-13: 12:00:00 2.5 [IU]/h via INTRAVENOUS
  Filled 2021-11-12: qty 500

## 2021-11-12 MED ORDER — LACTATED RINGERS IV SOLN: INTRAVENOUS | Status: DC

## 2021-11-12 MED ORDER — FENTANYL CITRATE (PF) 100 MCG/2ML IJ SOLN
50.0000 ug | INTRAMUSCULAR | Status: DC | PRN
  Administered 2021-11-13 (×3): 100 ug via INTRAVENOUS
  Administered 2021-11-13: 02:00:00 50 ug via INTRAVENOUS
  Filled 2021-11-12 (×4): qty 2

## 2021-11-12 MED ORDER — TERBUTALINE SULFATE 1 MG/ML IJ SOLN: 0.2500 mg | Freq: Once | INTRAMUSCULAR | Status: DC | PRN

## 2021-11-12 MED ORDER — SOD CITRATE-CITRIC ACID 500-334 MG/5ML PO SOLN: 30.0000 mL | ORAL | Status: DC | PRN

## 2021-11-12 MED ORDER — ACETAMINOPHEN 325 MG PO TABS: 650.0000 mg | ORAL_TABLET | ORAL | Status: DC | PRN

## 2021-11-12 MED ORDER — LIDOCAINE HCL (PF) 1 % IJ SOLN: 30.0000 mL | INTRAMUSCULAR | Status: DC | PRN

## 2021-11-12 MED ORDER — PENICILLIN G POT IN DEXTROSE 60000 UNIT/ML IV SOLN
3.0000 10*6.[IU] | INTRAVENOUS | Status: DC
  Administered 2021-11-13 (×3): 3 10*6.[IU] via INTRAVENOUS
  Filled 2021-11-12 (×3): qty 50

## 2021-11-12 MED ORDER — SODIUM CHLORIDE 0.9 % IV SOLN
5.0000 10*6.[IU] | Freq: Once | INTRAVENOUS | Status: AC
  Administered 2021-11-12: 23:00:00 5 10*6.[IU] via INTRAVENOUS
  Filled 2021-11-12: qty 5

## 2021-11-12 MED ORDER — OXYCODONE-ACETAMINOPHEN 5-325 MG PO TABS: 2.0000 | ORAL_TABLET | ORAL | Status: DC | PRN
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
## 2021-11-12 NOTE — MAU Note (Signed)
Rhonda Sparks is a 20 y.o. at [redacted]w[redacted]d here in MAU reporting: cramping with scant amount pinkish colored discharge. Denies SROM, vaginal bleeding or bloody show. Endorses+ fetal movement but says it is a little decreased. Onset of complaint: Wednesday Pain score: 4 Vitals:   11/12/21 1921  BP: 125/72  Pulse: (!) 109  Resp: 16  Temp: 98.1 F (36.7 C)  SpO2: 97%     FHT:135bpm Lab orders placed from triage:  MAU labor
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
## 2021-11-12 NOTE — MAU Note (Incomplete)
Admission report called to LD charge RN  pt to go to room 206 for Troy Sine RN

## 2021-11-13 ENCOUNTER — Inpatient Hospital Stay (HOSPITAL_COMMUNITY): Payer: Medicaid Other | Admitting: Anesthesiology

## 2021-11-13 ENCOUNTER — Encounter (HOSPITAL_COMMUNITY): Payer: Self-pay | Admitting: Obstetrics and Gynecology

## 2021-11-13 DIAGNOSIS — O24420 Gestational diabetes mellitus in childbirth, diet controlled: Secondary | ICD-10-CM

## 2021-11-13 DIAGNOSIS — O368130 Decreased fetal movements, third trimester, not applicable or unspecified: Secondary | ICD-10-CM

## 2021-11-13 DIAGNOSIS — O99820 Streptococcus B carrier state complicating pregnancy: Secondary | ICD-10-CM

## 2021-11-13 DIAGNOSIS — Z3A39 39 weeks gestation of pregnancy: Secondary | ICD-10-CM

## 2021-11-13 LAB — GLUCOSE, CAPILLARY
Glucose-Capillary: 126 mg/dL — ABNORMAL HIGH (ref 70–99)
Glucose-Capillary: 113 mg/dL — ABNORMAL HIGH (ref 70–99)
Glucose-Capillary: 112 mg/dL — ABNORMAL HIGH (ref 70–99)

## 2021-11-13 LAB — RPR: RPR Ser Ql: NONREACTIVE

## 2021-11-13 MED ORDER — EPHEDRINE 5 MG/ML INJ: 10.0000 mg | INTRAVENOUS | Status: DC | PRN

## 2021-11-13 MED ORDER — WITCH HAZEL-GLYCERIN EX PADS: 1.0000 "application " | MEDICATED_PAD | CUTANEOUS | Status: DC | PRN

## 2021-11-13 MED ORDER — DIBUCAINE (PERIANAL) 1 % EX OINT: 1.0000 "application " | TOPICAL_OINTMENT | CUTANEOUS | Status: DC | PRN

## 2021-11-13 MED ORDER — ONDANSETRON HCL 4 MG PO TABS: 4.0000 mg | ORAL_TABLET | ORAL | Status: DC | PRN

## 2021-11-13 MED ORDER — LIDOCAINE HCL (PF) 1 % IJ SOLN
INTRAMUSCULAR | Status: DC | PRN
  Administered 2021-11-13: 5 mL via EPIDURAL

## 2021-11-13 MED ORDER — DIPHENHYDRAMINE HCL 25 MG PO CAPS: 25.0000 mg | ORAL_CAPSULE | Freq: Four times a day (QID) | ORAL | Status: DC | PRN

## 2021-11-13 MED ORDER — BENZOCAINE-MENTHOL 20-0.5 % EX AERO: 1.0000 "application " | INHALATION_SPRAY | CUTANEOUS | Status: DC | PRN

## 2021-11-13 MED ORDER — IBUPROFEN 600 MG PO TABS
600.0000 mg | ORAL_TABLET | Freq: Four times a day (QID) | ORAL | Status: DC
  Administered 2021-11-13 – 2021-11-14 (×4): 600 mg via ORAL
  Filled 2021-11-13 (×4): qty 1

## 2021-11-13 MED ORDER — SIMETHICONE 80 MG PO CHEW: 80.0000 mg | CHEWABLE_TABLET | ORAL | Status: DC | PRN

## 2021-11-13 MED ORDER — ACETAMINOPHEN 325 MG PO TABS
650.0000 mg | ORAL_TABLET | ORAL | Status: DC | PRN
  Administered 2021-11-14 (×2): 650 mg via ORAL
  Filled 2021-11-13 (×2): qty 2

## 2021-11-13 MED ORDER — LACTATED RINGERS IV SOLN
500.0000 mL | Freq: Once | INTRAVENOUS | Status: AC
  Administered 2021-11-13: 08:00:00 500 mL via INTRAVENOUS

## 2021-11-13 MED ORDER — FENTANYL-BUPIVACAINE-NACL 0.5-0.125-0.9 MG/250ML-% EP SOLN
12.0000 mL/h | EPIDURAL | Status: DC | PRN
  Administered 2021-11-13: 09:00:00 12 mL/h via EPIDURAL
  Filled 2021-11-13: qty 250

## 2021-11-13 MED ORDER — SENNOSIDES-DOCUSATE SODIUM 8.6-50 MG PO TABS
2.0000 | ORAL_TABLET | ORAL | Status: DC
  Administered 2021-11-13: 17:00:00 2 via ORAL
  Filled 2021-11-13: qty 2

## 2021-11-13 MED ORDER — TETANUS-DIPHTH-ACELL PERTUSSIS 5-2.5-18.5 LF-MCG/0.5 IM SUSY: 0.5000 mL | PREFILLED_SYRINGE | Freq: Once | INTRAMUSCULAR | Status: DC

## 2021-11-13 MED ORDER — PRENATAL MULTIVITAMIN CH
1.0000 | ORAL_TABLET | Freq: Every day | ORAL | Status: DC
  Administered 2021-11-13 – 2021-11-14 (×2): 1 via ORAL
  Filled 2021-11-13 (×2): qty 1

## 2021-11-13 MED ORDER — PHENYLEPHRINE 40 MCG/ML (10ML) SYRINGE FOR IV PUSH (FOR BLOOD PRESSURE SUPPORT): 80.0000 ug | PREFILLED_SYRINGE | INTRAVENOUS | Status: DC | PRN

## 2021-11-13 MED ORDER — LIDOCAINE-EPINEPHRINE (PF) 1.5 %-1:200000 IJ SOLN
INTRAMUSCULAR | Status: DC | PRN
  Administered 2021-11-13: 5 mL via EPIDURAL

## 2021-11-13 MED ORDER — MEASLES, MUMPS & RUBELLA VAC IJ SOLR: 0.5000 mL | Freq: Once | INTRAMUSCULAR | Status: DC

## 2021-11-13 MED ORDER — DIPHENHYDRAMINE HCL 50 MG/ML IJ SOLN: 12.5000 mg | INTRAMUSCULAR | Status: DC | PRN

## 2021-11-13 MED ORDER — ONDANSETRON HCL 4 MG/2ML IJ SOLN: 4.0000 mg | INTRAMUSCULAR | Status: DC | PRN

## 2021-11-13 MED ORDER — COCONUT OIL OIL: 1.0000 "application " | TOPICAL_OIL | Status: DC | PRN

## 2021-11-13 NOTE — Progress Notes (Signed)
Labor Progress Note Rhonda Sparks is a 20 y.o. G2P1001 at [redacted]w[redacted]d presented for IOL d/t DFM.   S: Feeling contractions more, but tolerable.   O:  BP (!) 113/47    Pulse 95    Temp 98.3 F (36.8 C) (Oral)    Resp 18    Ht 5\' 1"  (1.549 m)    Wt 91.6 kg    LMP 02/11/2021    SpO2 99%    BMI 38.17 kg/m  EFM: 130/mod/15x15/none  CVE: Dilation: 4 Effacement (%): 70 Station: -3 Presentation: Vertex Exam by:: Dr. [email or web address]   A&P: 20 y.o. G2P1001 [redacted]w[redacted]d  #Labor: Some progression since last check, cervix still quite posterior. Will cont to titrate pit as tolerated. Hopeful for AROM on next check.  #Pain: Epidural when ready  #FWB: Cat I #GBS positive, just receiving 2nd dose    [redacted]w[redacted]d, DO 6:38 AM

## 2021-11-13 NOTE — Anesthesia Preprocedure Evaluation (Signed)
Anesthesia Evaluation  Patient identified by MRN, date of birth, ID band Patient awake    Reviewed: Allergy & Precautions, H&P , NPO status , Patient's Chart, lab work & pertinent test results  History of Anesthesia Complications Negative for: history of anesthetic complications  Airway Mallampati: II  TM Distance: >3 FB Neck ROM: full    Dental no notable dental hx. (+) Teeth Intact   Pulmonary former smoker,    Pulmonary exam normal breath sounds clear to auscultation       Cardiovascular Normal cardiovascular exam Rhythm:regular Rate:Normal     Neuro/Psych  Headaches,    GI/Hepatic   Endo/Other  diabetes, Gestational  Renal/GU      Musculoskeletal   Abdominal   Peds  Hematology  (+) Blood dyscrasia, anemia ,   Anesthesia Other Findings   Reproductive/Obstetrics (+) Pregnancy                             Anesthesia Physical Anesthesia Plan  ASA: 2  Anesthesia Plan: Epidural   Post-op Pain Management:    Induction:   PONV Risk Score and Plan:   Airway Management Planned:   Additional Equipment:   Intra-op Plan:   Post-operative Plan:   Informed Consent: I have reviewed the patients History and Physical, chart, labs and discussed the procedure including the risks, benefits and alternatives for the proposed anesthesia with the patient or authorized representative who has indicated his/her understanding and acceptance.       Plan Discussed with:   Anesthesia Plan Comments:         Anesthesia Quick Evaluation

## 2021-11-13 NOTE — Anesthesia Procedure Notes (Signed)
Epidural Patient location during procedure: OB Start time: 11/13/2021 8:23 AM End time: 11/13/2021 8:33 AM  Staffing Anesthesiologist: Leonides Grills, MD Performed: anesthesiologist   Preanesthetic Checklist Completed: patient identified, IV checked, site marked, risks and benefits discussed, monitors and equipment checked, pre-op evaluation and timeout performed  Epidural Patient position: sitting Prep: DuraPrep Patient monitoring: heart rate, cardiac monitor, continuous pulse ox and blood pressure Approach: midline Location: L3-L4 Injection technique: LOR air  Needle:  Needle type: Tuohy  Needle gauge: 17 G Needle length: 9 cm Needle insertion depth: 9 cm Catheter type: closed end flexible Catheter size: 19 Gauge Catheter at skin depth: 16 cm Test dose: negative and 1.5% lidocaine with Epi 1:200 K  Assessment Events: blood not aspirated, injection not painful, no injection resistance and negative IV test  Additional Notes Informed consent obtained prior to proceeding including risk of failure, 1% risk of PDPH, risk of minor discomfort and bruising. Discussed alternatives to epidural analgesia and patient desires to proceed.  Timeout performed pre-procedure verifying patient name, procedure, and platelet count.  Loss of resistance encountered on the second attempt. Patient tolerated procedure well. Reason for block:procedure for pain

## 2021-11-13 NOTE — Discharge Summary (Signed)
Postpartum Discharge Summary  Date of Service updated 11/14/2021      Patient Name: Rhonda Sparks DOB: [birth year] MRN: [id number]  Date of admission: 11/12/2021 Delivery date:11/13/2021  Delivering provider: Julianne Handler  Date of discharge: 11/14/2021  Admitting diagnosis: Indication for care in labor and delivery, antepartum [O75.9] Intrauterine pregnancy: [redacted]w[redacted]d    Secondary diagnosis:  Principal Problem:   Indication for care in labor and delivery, antepartum Active Problems:   SVD (spontaneous vaginal delivery)  Additional problems: A1GDM, GBS carrier, hx gHTN in previous pregnancy    Discharge diagnosis: Term Pregnancy Delivered and GDM A1                                              Post partum procedures: NONE Augmentation: AROM and Pitocin Complications: None  Hospital course: Induction of Labor With Vaginal Delivery   20y.o. yo G2P1001 at 367w2das admitted to the hospital 11/12/2021 for induction of labor.  Indication for induction:  decreased FM .  Patient had an uncomplicated labor course as follows: Membrane Rupture Time/Date: 9:25 AM ,11/13/2021   Delivery Method:Vaginal, Spontaneous  Episiotomy: None  Lacerations:  1st degree  Details of delivery can be found in separate delivery note.  Patient had a routine postpartum course. Patient is discharged home 11/14/21.  Newborn Data: Birth date:11/13/2021  Birth time:11:21 AM  Gender:Female  Living status:Living  Apgars:9 ,9  Weight:3900 g   Magnesium Sulfate received: No BMZ received: No Rhophylac:N/A MMR:N/A T-DaP:Given prenatally Flu: No Transfusion:No  Physical exam  Vitals:   11/13/21 2118 11/14/21 0111 11/14/21 0620 11/14/21 1431  BP: 115/76 118/70 116/71 133/70  Pulse: 91 87 95 70  Resp: 18 18 16 16   Temp: 98.8 F (37.1 C) 98.1 F (36.7 C) 97.7 F (36.5 C) 98.6 F (37 C)  TempSrc: Oral Oral Oral Oral  SpO2: 95% 99% 98% 100%  Weight:      Height:       General: alert,  cooperative, and no distress Lochia: appropriate Uterine Fundus: firm Incision: N/A DVT Evaluation: No evidence of DVT seen on physical exam. Labs: Lab Results  Component Value Date   WBC 7.5 11/12/2021   HGB 11.7 (L) 11/12/2021   HCT 34.8 (L) 11/12/2021   MCV 92.6 11/12/2021   PLT 169 11/12/2021   CMP Latest Ref Rng & Units 05/02/2021  Glucose 65 - 99 mg/dL 75  BUN 6 - 20 mg/dL 7  Creatinine 0.57 - 1.00 mg/dL 0.59  Sodium 134 - 144 mmol/L 137  Potassium 3.5 - 5.2 mmol/L 3.9  Chloride 96 - 106 mmol/L 101  CO2 20 - 29 mmol/L 21  Calcium 8.7 - 10.2 mg/dL 9.9  Total Protein 6.0 - 8.5 g/dL 7.0  Total Bilirubin 0.0 - 1.2 mg/dL 0.5  Alkaline Phos 42 - 106 IU/L 79  AST 0 - 40 IU/L 18  ALT 0 - 32 IU/L 12   Edinburgh Score: Edinburgh Postnatal Depression Scale Screening Tool 11/13/2021  I have been able to laugh and see the funny side of things. 0  I have looked forward with enjoyment to things. 0  I have blamed myself unnecessarily when things went wrong. 0  I have been anxious or worried for no good reason. 0  I have felt scared or panicky for no good reason. 0  Things have been getting on top  of me. 0  I have been so unhappy that I have had difficulty sleeping. 0  I have felt sad or miserable. 0  I have been so unhappy that I have been crying. 0  The thought of harming myself has occurred to me. 0  Edinburgh Postnatal Depression Scale Total 0     After visit meds:  Allergies as of 11/14/2021   No Known Allergies      Medication List     STOP taking these medications    Accu-Chek Guide w/Device Kit   Accu-Chek Softclix Lancets lancets   glucose blood test strip   prenatal vitamin w/FE, FA 27-1 MG Tabs tablet   terconazole 0.4 % vaginal cream Commonly known as: TERAZOL 7       TAKE these medications    acetaminophen 325 MG tablet Commonly known as: Tylenol Take 2 tablets (650 mg total) by mouth every 4 (four) hours as needed (for pain scale < 4).    aspirin EC 81 MG tablet Take 1 tablet (81 mg total) by mouth daily. Swallow whole. Take in pregnancy to prevent preeclampsia.   ibuprofen 600 MG tablet Commonly known as: ADVIL Take 1 tablet (600 mg total) by mouth every 6 (six) hours.               Discharge Care Instructions  (From admission, onward)           Start     Ordered   11/14/21 0000  Discharge wound care:       Comments: As per discharge handout and nursing instructions   11/14/21 1748             Discharge home in stable condition Infant Feeding: Bottle and Breast Infant Disposition:home with mother Discharge instruction: per After Visit Summary and Postpartum booklet. Activity: Advance as tolerated. Pelvic rest for 6 weeks.  Diet: routine diet Future Appointments: Future Appointments  Date Time Provider [place]  12/26/2021  8:15 AM Virginia Rochester, NP [facility]  12/26/2021  8:50 AM [facility] [place]   Follow up Visit:   Please schedule this patient for a In person postpartum visit in 6 weeks with the following provider: Any provider. Additional Postpartum F/U:2 hour GTT  High risk pregnancy complicated by: GDM Delivery mode:  Vaginal, Spontaneous  Anticipated Birth Control: Possibly Paraguard   11/14/2021 Katherine Basset, DO

## 2021-11-13 NOTE — Anesthesia Postprocedure Evaluation (Signed)
Anesthesia Post Note  Patient: Rhonda Sparks  Procedure(s) Performed: AN AD HOC LABOR EPIDURAL     Patient location during evaluation: Mother Baby Anesthesia Type: Epidural Level of consciousness: awake and alert, oriented and patient cooperative Pain management: pain level controlled Vital Signs Assessment: post-procedure vital signs reviewed and stable Respiratory status: spontaneous breathing Cardiovascular status: stable Postop Assessment: no headache, epidural receding, patient able to bend at knees and no signs of nausea or vomiting Anesthetic complications: no Comments: Pt. States she is walking.  Pain score 7.  Pt. Just received pain medication immediately prior to interview.  Pt. Encouraged to communicate pain needs with bedside RN.    No notable events documented.  Last Vitals:  Vitals:   11/13/21 1430 11/13/21 1710  BP: 122/70 129/70  Pulse: 88 90  Resp: 18 18  Temp: 37 C 36.8 C  SpO2:      Last Pain:  Vitals:   11/13/21 1710  TempSrc: Oral  PainSc: 4    Pain Goal: Patients Stated Pain Goal: 2 (11/13/21 0805)              Epidural/Spinal Function Cutaneous sensation: Normal sensation (11/13/21 1710), Patient able to flex knees: Yes (11/13/21 1710), Patient able to lift hips off bed: Yes (11/13/21 1710), Back pain beyond tenderness at insertion site: No (11/13/21 1710), Progressively worsening motor and/or sensory loss: No (11/13/21 1710), Bowel and/or bladder incontinence post epidural: No (11/13/21 1710)  [facility]

## 2021-11-13 NOTE — Lactation Note (Signed)
This note was copied from a baby's chart. Lactation Consultation Note  Patient Name: Rhonda Sparks [id number] Date: 11/13/2021 Reason for consult: L&D Initial assessment;Term;Other (Comment) (LC LD visit at 40 minsPP / LC noted the baby to be latched on the Lt. breast  football shallow. LC assisted to reposition the baby and work on deep latch/ on and off. Baby STS .) Age:21 mins  Maternal Data Has patient been taught Hand Expression?: Yes (mom experienced for short time - able to hand express) Does the patient have breastfeeding experience prior to this delivery?: Yes How long did the patient breastfeed?: per mom didn't breastfeed lomg and milk didn't come in on right breast  Feeding Mother's Current Feeding Choice: Breast Milk  LATCH Score Latch: Repeated attempts needed to sustain latch, nipple held in mouth throughout feeding, stimulation needed to elicit sucking reflex.  Audible Swallowing: None  Type of Nipple: Everted at rest and after stimulation (areola edema noted - compresible)  Comfort (Breast/Nipple): Soft / non-tender  Hold (Positioning): Assistance needed to correctly position infant at breast and maintain latch.  LATCH Score: 6   Lactation Tools Discussed/Used    Interventions Interventions: Breast feeding basics reviewed;Assisted with latch;Skin to skin;Hand express;Reverse pressure;Breast compression;Adjust position;Support pillows;Education  Discharge WIC Program: Yes (per chart)  Consult Status Consult Status: Follow-up from L&D Date: 11/13/21 Follow-up type: In-patient    [name] 11/13/2021, 12:35 PM

## 2021-11-13 NOTE — Progress Notes (Signed)
Labor Progress Note Rhonda Sparks is a 20 y.o. G2P1001 at [redacted]w[redacted]d presented for IOL for DFM  S:  Comfortable with epidural. No c/o.  O:  BP 116/64    Pulse 91    Temp 98.3 F (36.8 C) (Oral)    Resp 16    Ht 5\' 1"  (1.549 m)    Wt 91.6 kg    LMP 02/11/2021    SpO2 98%    BMI 38.17 kg/m  EFM: baseline 120 bpm/ mod variability/ + accels/ no decels  Toco/IUPC: 2-4 SVE: Dilation: 5.5 Effacement (%): 70 Cervical Position:  (pt left side) Station: -2 Presentation: Vertex Exam by:: [email or web address] CNM Pitocin: 16 mu/min  A/P: 20 y.o. G2P1001 [redacted]w[redacted]d  1. Labor: early active 2. FWB: Cat I 3. Pain: epidural   Consented for AROM. AROM'd for small amt clear fluid. Continue Pitocin titration. Anticipate labor progress and SVD.  [redacted]w[redacted]d, CNM 9:30 AM

## 2021-11-14 ENCOUNTER — Encounter: Payer: Medicaid Other | Admitting: Obstetrics and Gynecology

## 2021-11-14 LAB — GLUCOSE, CAPILLARY: Glucose-Capillary: 119 mg/dL — ABNORMAL HIGH (ref 70–99)

## 2021-11-14 MED ORDER — LIDOCAINE HCL 1 % IJ SOLN
0.0000 mL | Freq: Once | INTRAMUSCULAR | Status: DC | PRN
  Filled 2021-11-14 (×2): qty 20

## 2021-11-14 MED ORDER — IBUPROFEN 600 MG PO TABS: 600.0000 mg | ORAL_TABLET | Freq: Four times a day (QID) | ORAL | 0 refills | Status: DC

## 2021-11-14 MED ORDER — ACETAMINOPHEN 325 MG PO TABS
650.0000 mg | ORAL_TABLET | ORAL | 0 refills | Status: DC | PRN
Start: 2021-11-14 — End: 2023-01-07

## 2021-11-14 MED ORDER — ETONOGESTREL 68 MG ~~LOC~~ IMPL
68.0000 mg | DRUG_IMPLANT | Freq: Once | SUBCUTANEOUS | Status: DC
  Filled 2021-11-14 (×2): qty 1
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
## 2021-11-14 NOTE — Progress Notes (Signed)
Patient's nurse discussed discharge teaching with patient & family members. Upon exiting the room nurse was stopped and questioned about circumcision and consent forms. Nurse asked for help from this Charge nurse & nursery staff to locate circumcision consent form for circumcision that was completed earlier in the day. Patient and family member came to the nurse's desk to demand their paper chart and their infant's chart. I informed them that they could access everything through Mychart and the only physical papers in the chart were the consent forms that we had already showed her and patient stickers. Situation escalated and House coverage was called, House coverage proceeded to call Security. Patient continued to swear at staff and refused to talk to [facility] coverage. Patient wanted names of all nurses, staff, and doctors involved written down. Patient returned to room to pack up & proceed with discharge. Patient took pictures of staff/recorded with her phone as she was escorted out by [employer]. Patient threatened staff several times that she was in contact with her lawyer. Patient stated that she was not to be messed with and would go back to jail over her kid.

## 2021-11-15 NOTE — Progress Notes (Signed)
Called by staff due to pt being argumentative with staff at the nursing station.  Arrived on unit to find pt and support person talking to RN providing care and Charge RN about consent paperwork.  The patient was asking for the names of the personnel who were involved in the babies circumcision.  I advised that the staff could share that information.  The pt began raising her voice and continuing to be argumentative.  I asked the pt to lower her voice.  She stated "I am not listening to you" and continued to be loud and aggressive.  I then called security.  Before security arrived, the patient went back to her room after generally saying to all present "shut the fuck up, you bitch.".  The staff reported that the patient had actually been discharged.  When security arrived I asked to ensure that the patient was escorted out promptly.   Conley Simmonds BSN, Higher education careers adviser    [facility] [employer] [phone] [email or web address]

## 2021-11-27 ENCOUNTER — Telehealth (HOSPITAL_COMMUNITY): Payer: Self-pay | Admitting: *Deleted

## 2021-11-27 NOTE — Telephone Encounter (Signed)
No voicemail set up, unable to leave message.  Duffy Rhody, RN 11/27/2021 At 10:57am

## 2021-12-26 ENCOUNTER — Other Ambulatory Visit: Payer: Self-pay

## 2021-12-26 ENCOUNTER — Other Ambulatory Visit: Payer: Medicaid Other

## 2021-12-26 ENCOUNTER — Ambulatory Visit: Payer: Medicaid Other | Admitting: Nurse Practitioner

## 2021-12-26 DIAGNOSIS — O24419 Gestational diabetes mellitus in pregnancy, unspecified control: Secondary | ICD-10-CM

## 2021-12-26 NOTE — Progress Notes (Deleted)
° ° °  Post Partum Visit Note  Rhonda Sparks is a 21 y.o. G80P2002 female who presents for a postpartum visit. She is 6 weeks postpartum following a normal spontaneous vaginal delivery.  I have fully reviewed the prenatal and intrapartum course. The delivery was at 39.2 gestational weeks.  Anesthesia: epidural. Postpartum course has been ***. Baby is doing well***. Baby is feeding by {breast/bottle:69}. Bleeding {vag bleed:12292}. Bowel function is {normal:32111}. Bladder function is {normal:32111}. Patient {is/is not:9024} sexually active. Contraception method is {contraceptive method:5051}. Postpartum depression screening: {gen negative/positive:315881}.   The pregnancy intention screening data noted above was reviewed. Potential methods of contraception were discussed. The patient elected to proceed with No data recorded.    Health Maintenance Due  Topic Date Due   COVID-19 Vaccine (1) Never done   URINE MICROALBUMIN  Never done   HPV VACCINES (1 - 2-dose series) Never done   PAP-Cervical Cytology Screening  Never done   PAP SMEAR-Modifier  Never done   INFLUENZA VACCINE  06/19/2021    {Common ambulatory SmartLinks:19316}  Review of Systems {ros; complete:30496}  Objective:  LMP 02/11/2021    General:  {gen appearance:16600}   Breasts:  {desc; normal/abnormal/not indicated:14647}  Lungs: {lung exam:16931}  Heart:  {heart exam:5510}  Abdomen: {abdomen exam:16834}   Wound {Wound assessment:11097}  GU exam:  {desc; normal/abnormal/not indicated:14647}       Assessment:    There are no diagnoses linked to this encounter.  *** postpartum exam.   Plan:   Essential components of care per ACOG recommendations:  1.  Mood and well being: Patient with {gen negative/positive:315881} depression screening today. Reviewed local resources for support.  - Patient tobacco use? {tobacco use:25506}  - hx of drug use? {yes/no:25505}    2. Infant care and feeding:  -Patient  currently breastmilk feeding? {yes/no:25502}  -Social determinants of health (SDOH) reviewed in EPIC. No concerns***The following needs were identified***  3. Sexuality, contraception and birth spacing - Patient {DOES_DOES MAU:63335} want a pregnancy in the next year.  Desired family size is {NUMBER 1-10:22536} children.  - Reviewed forms of contraception in tiered fashion. Patient desired {PLAN CONTRACEPTION:313102} today.   - Discussed birth spacing of 18 months  4. Sleep and fatigue -Encouraged family/partner/community support of 4 hrs of uninterrupted sleep to help with mood and fatigue  5. Physical Recovery  - Discussed patients delivery and complications. She describes her labor as {description:25511} - Patient had a {CHL AMB [phone]}. Patient had a {laceration:25518} laceration. Perineal healing reviewed. Patient expressed understanding - Patient has urinary incontinence? {yes/no:25515} - Patient {ACTION; IS/IS KTG:[id number]} safe to resume physical and sexual activity  6.  Health Maintenance - HM due items addressed {Yes or If no, why not?:20788} - Last pap smear No results found for: DIAGPAP Pap smear {done:10129} at today's visit.  -Breast Cancer screening indicated? {indicated:25516}  7. Chronic Disease/Pregnancy Condition follow up: {Follow up:25499}  - PCP follow up  Guy Begin, CMA [facility] [employer], [facility]

## 2022-03-18 IMAGING — US US MFM OB FOLLOW-UP
1 series · 14 of 28 positions shown · non-contrast
Comparison: none

[Series 1: us mfm ob follow-up · 39 acquisitions, 14 frames shown]
[im 2/39]
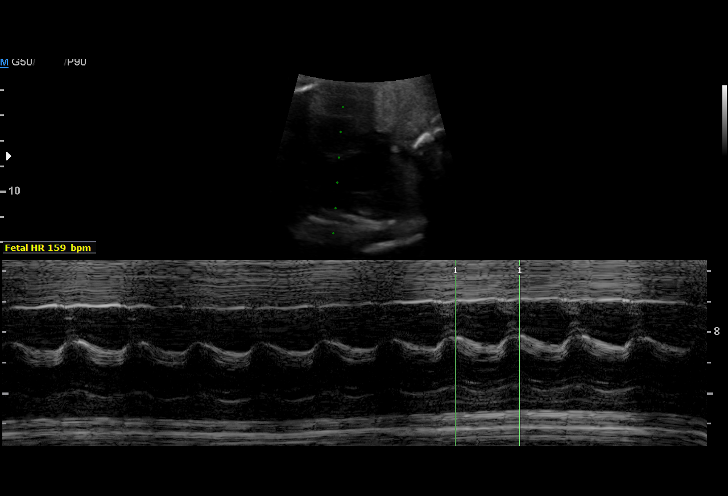
[im 5/39]
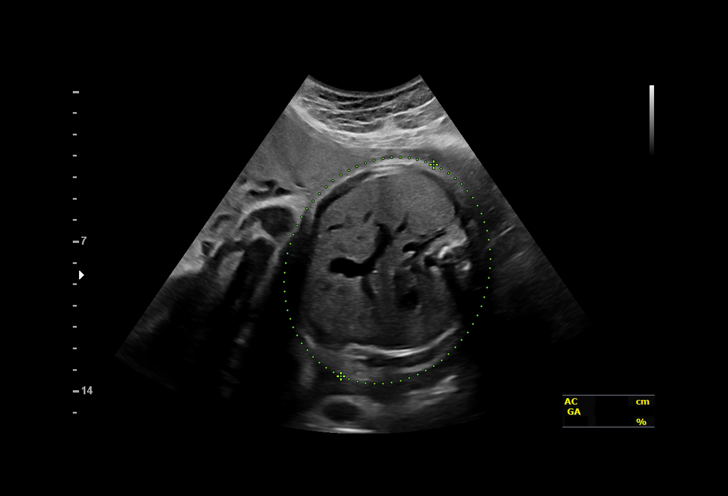
[im 8/39]
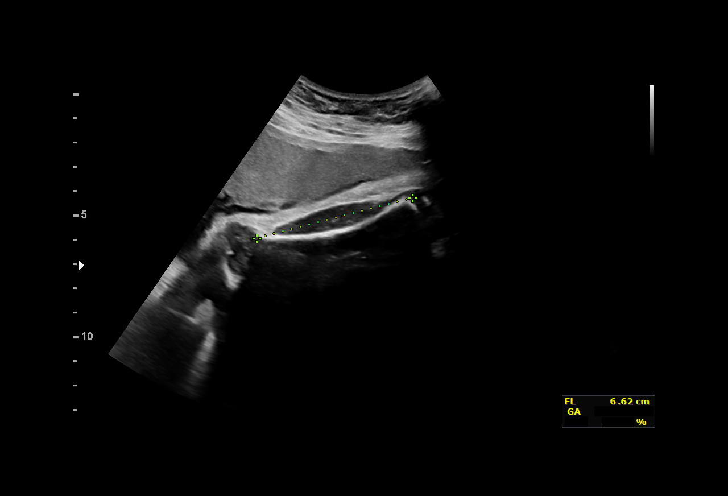
[im 10/39]
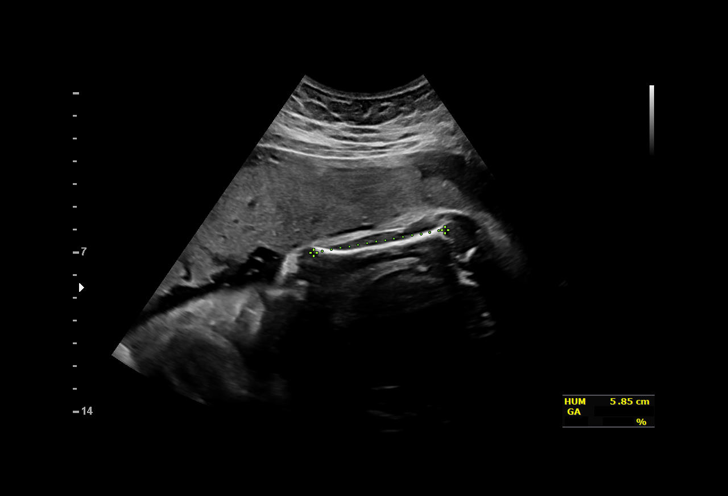
[im 13/39]
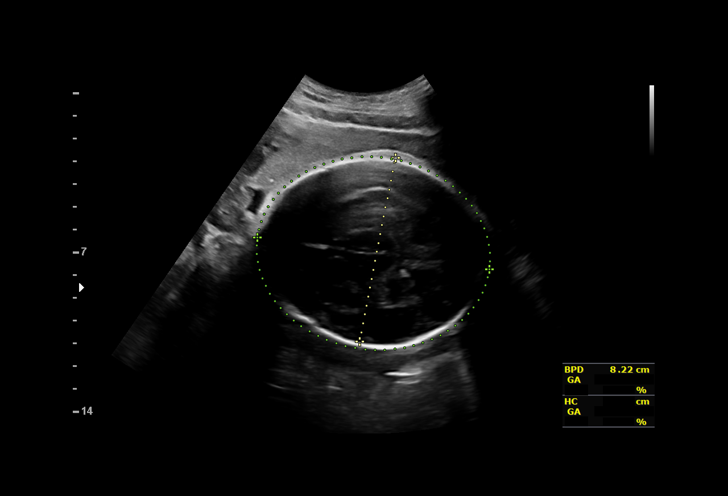
[im 16/39]
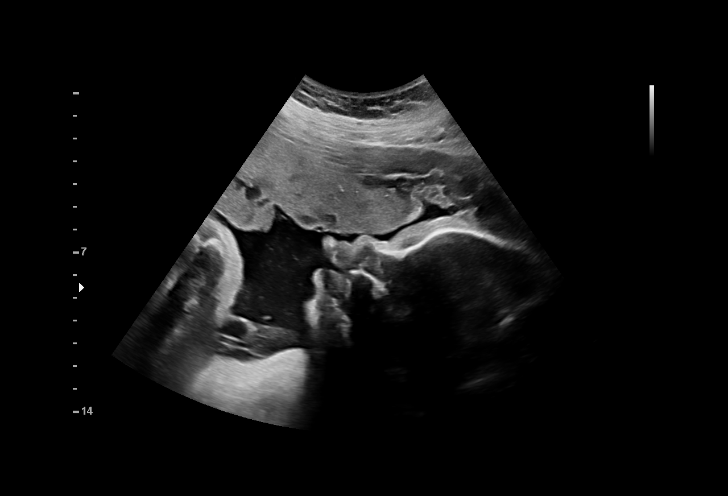
[im 19/39]
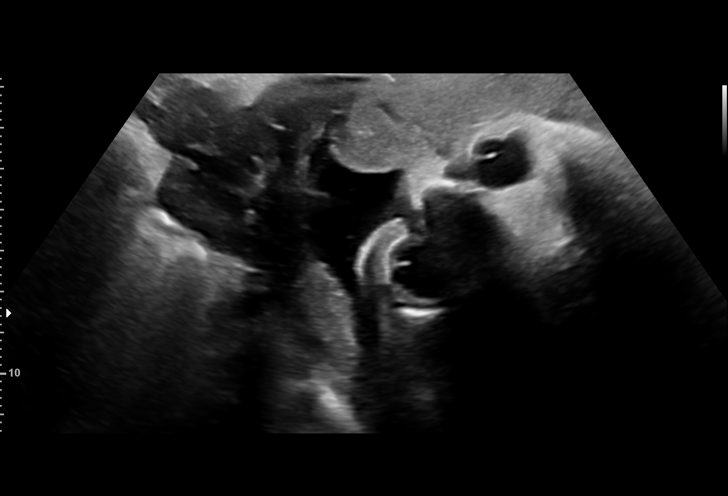
[im 22/39]
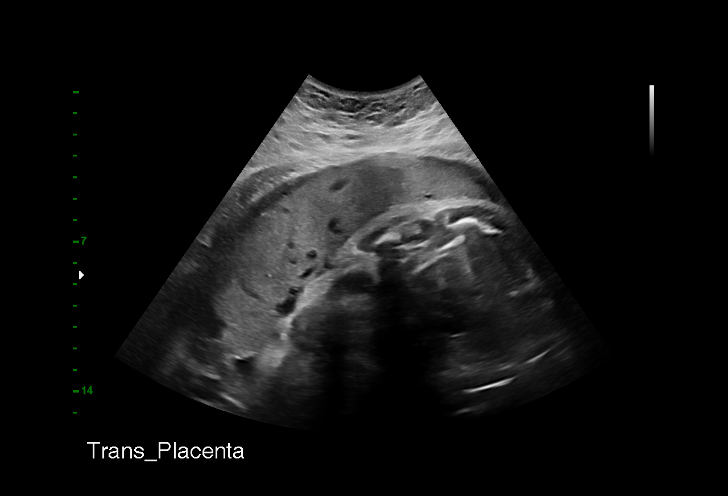
[im 24/39]
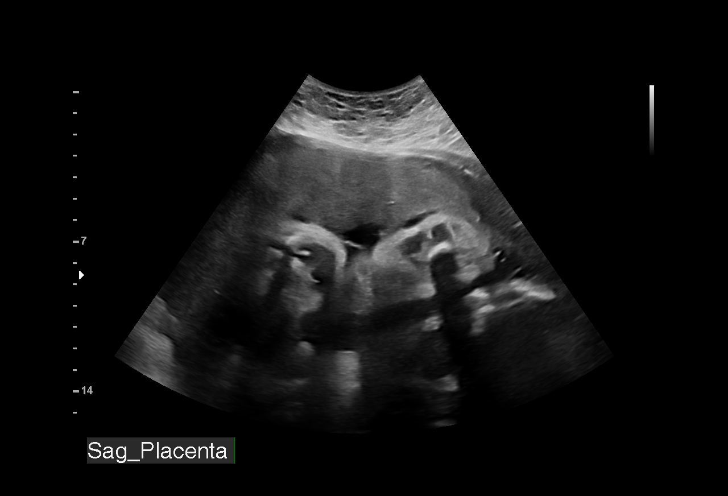
[im 27/39]
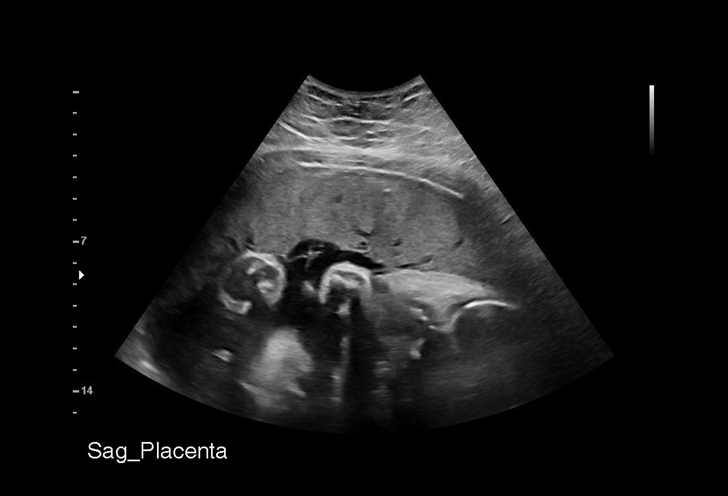
[im 30/39]
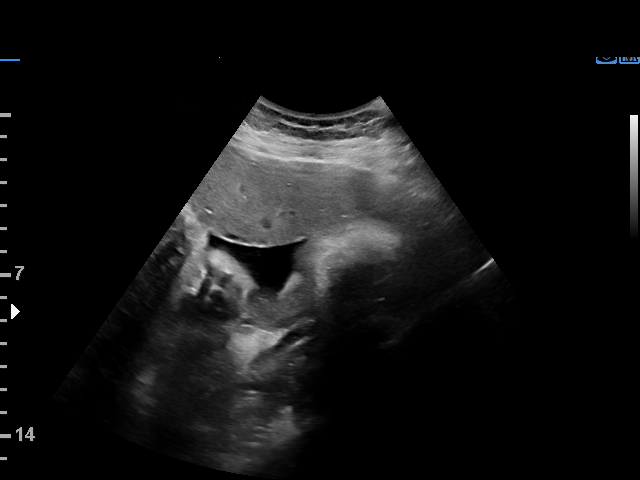
[im 33/39]
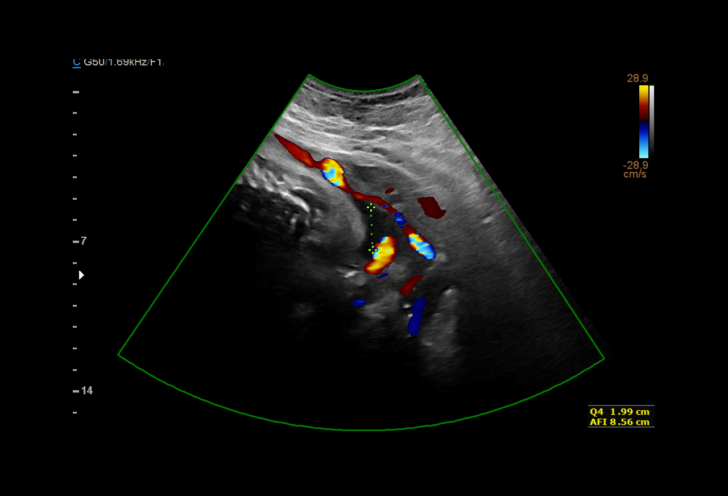
[im 36/39]
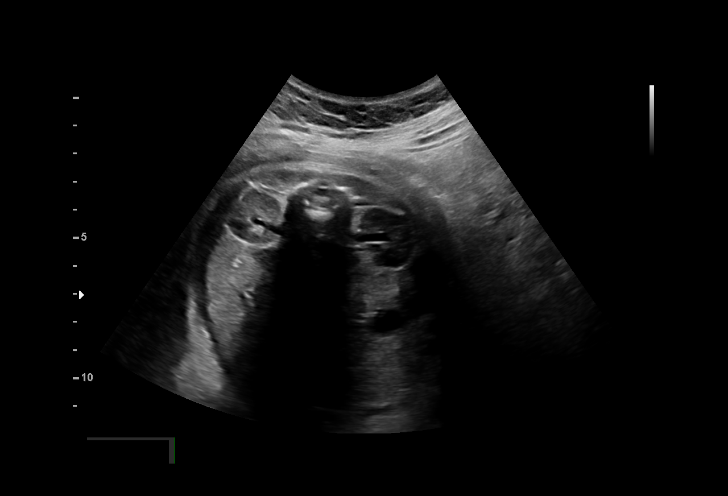
[im 39/39]
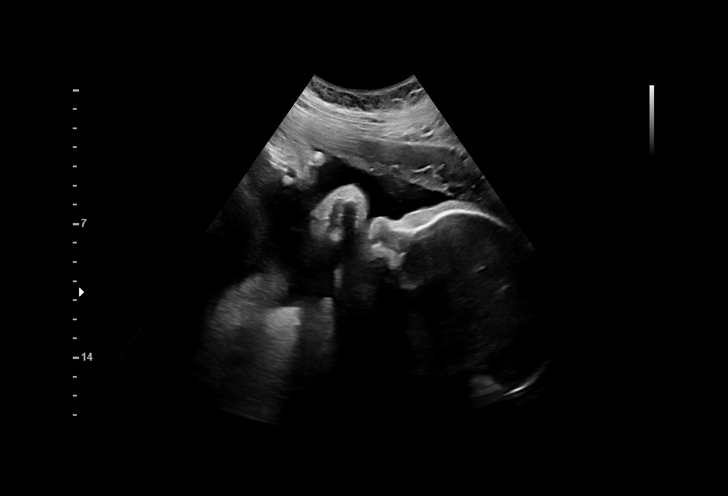

[14 of 28 positions shown; findings below may reference images not displayed]

Indications

 Large for gestational age fetus affecting
 management of mother
 32 weeks gestation of pregnancy
 Encounter for other antenatal screening
 follow-up
 LR NIPS, neg AFP, neg Horizon
Fetal Evaluation

 Num Of Fetuses:         1
 Fetal Heart Rate(bpm):  159
 Cardiac Activity:       Observed
 Presentation:           Cephalic
 Placenta:               Anterior
 P. Cord Insertion:      Previously Visualized

 Amniotic Fluid
 AFI FV:      Within normal limits

 AFI Sum(cm)     %Tile       Largest Pocket(cm)
 8.6             5

 RUQ(cm)       RLQ(cm)       LUQ(cm)        LLQ(cm)
 4.2           2             0
Biometry

 BPD:      81.9  mm     G. Age:  32w 6d         65  %    CI:        77.73   %    70 - 86
                                                         FL/HC:      22.4   %    19.1 -
 HC:       294   mm     G. Age:  32w 3d         21  %    HC/AC:      0.93        0.96 -
 AC:      315.5  mm     G. Age:  35w 3d       > 99  %    FL/BPD:     80.6   %    71 - 87
 FL:         66  mm     G. Age:  34w 0d         84  %    FL/AC:      20.9   %    20 - 24
 HUM:      57.6  mm     G. Age:  33w 3d         78  %
 Est. FW:    8118  gm      5 lb 6 oz     97  %
Gestational Age

 U/S Today:     33w 5d                                        EDD:   05/21/20
 Best:          32w 1d     Det. By:  U/S  (12/01/19)          EDD:   06/01/20
Anatomy

 Cranium:               Appears normal         Aortic Arch:            Previously seen
 Cavum:                 Previously seen        Ductal Arch:            Previously seen
 Ventricles:            Previously seen        Diaphragm:              Appears normal
 Choroid Plexus:        Previously seen        Stomach:                Appears normal, left
                                                                       sided
 Cerebellum:            Previously seen        Abdomen:                Previously seen
 Posterior Fossa:       Previously seen        Abdominal Wall:         Previously seen
 Nuchal Fold:           Not applicable (>20    Cord Vessels:           Previously seen
                        wks GA)
 Face:                  Profile previously     Kidneys:                Appear normal
                        seen; orbits nl
 Lips:                  Previously seen        Bladder:                Appears normal
 Thoracic:              Appears normal         Spine:                  Not well visualized
 Heart:                 Previously seen        Upper Extremities:      Previously seen
 RVOT:                  Previously seen        Lower Extremities:      Previously seen
 LVOT:                  Previously seen

 Other:  Technically difficult due to advanced GA and fetal position.
Cervix Uterus Adnexa

 Cervix
 Not visualized (advanced GA >61wks)
Impression

 Suspected fetal macrosomia.
 On ultrasound, the estimated fetal weight is at the 97th
 percentile. Amniotic fluid is normal and good fetal activity is
 seen .

 I informed the patient that ultrasound has limitations in
 accurately-estimating fetal weights especially in advanced
 gestation .

Recommendations

 Follow-up scans as clinically indicated.
                 [place], [place]

## 2022-04-12 IMAGING — US US MFM FETAL BPP W/O NON-STRESS
1 series · 12 of 23 positions shown · non-contrast
Comparison: none

[Series 1: us mfm fetal bpp w/o non-stress · 23 acquisitions, 12 frames shown]
[im 1/23]
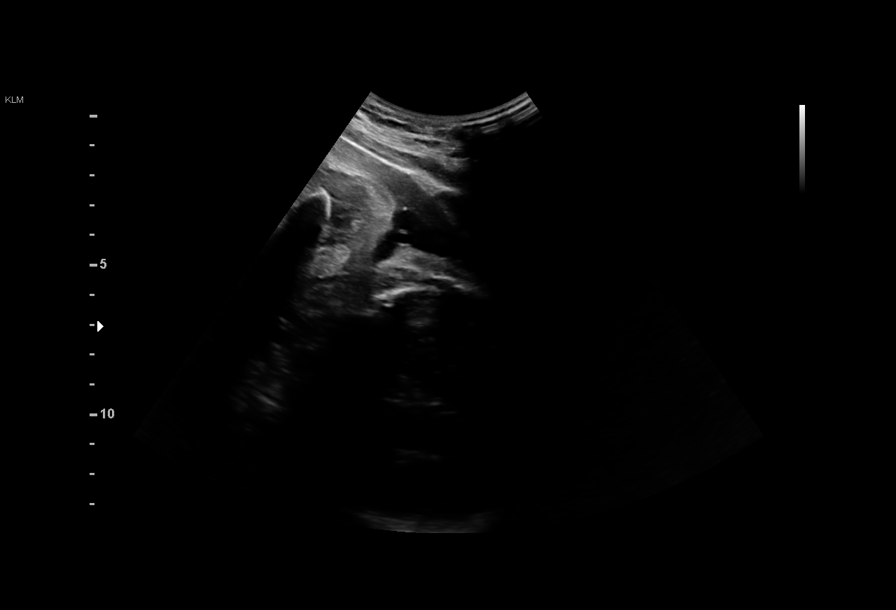
[im 3/23]
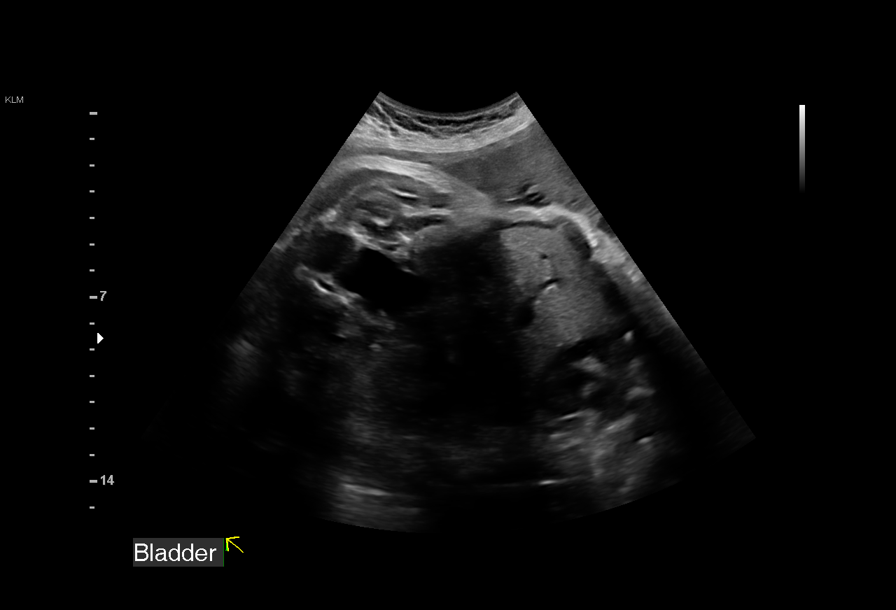
[im 5/23]
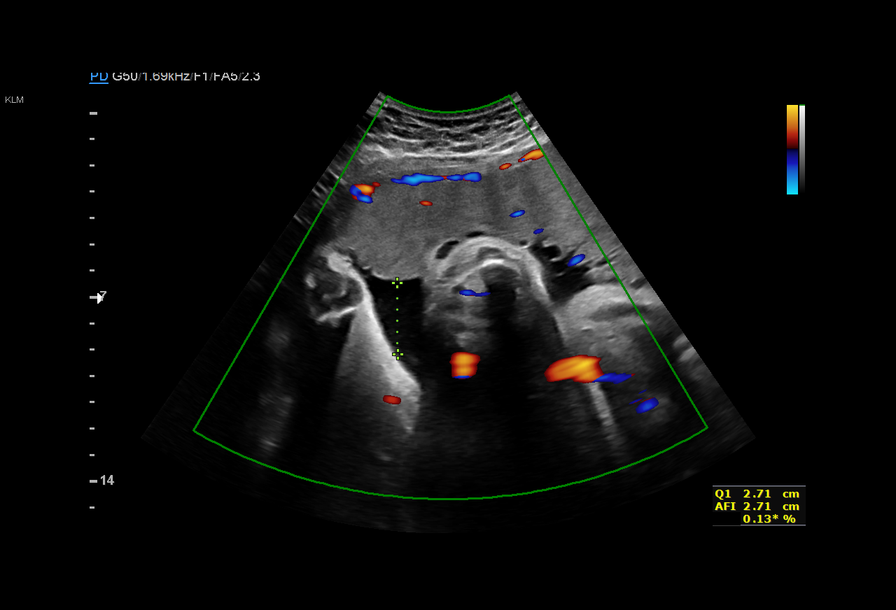
[im 7/23]
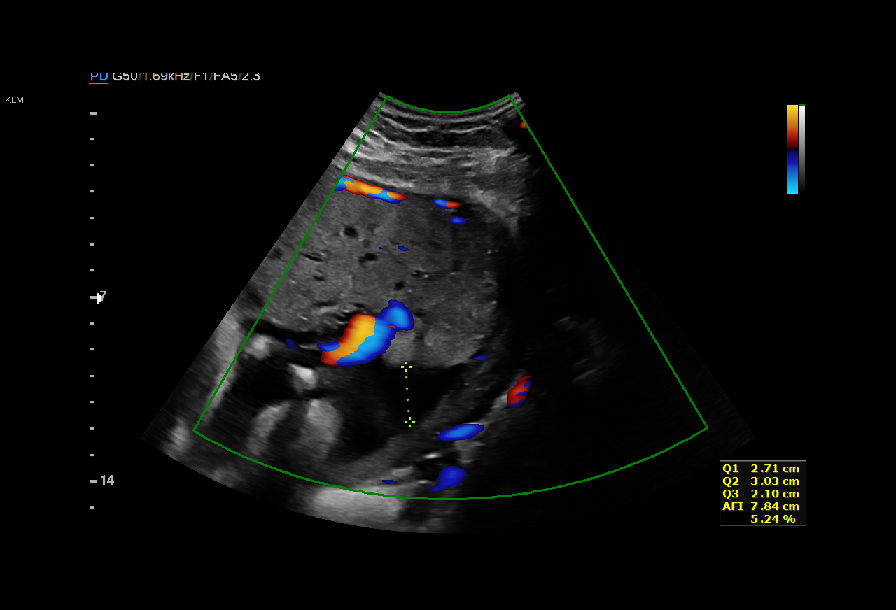
[im 9/23]
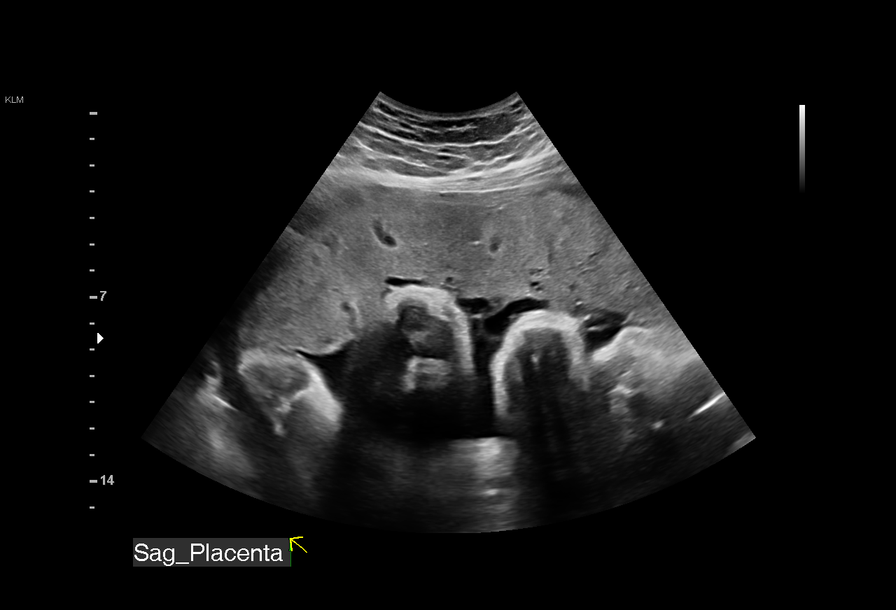
[im 11/23]
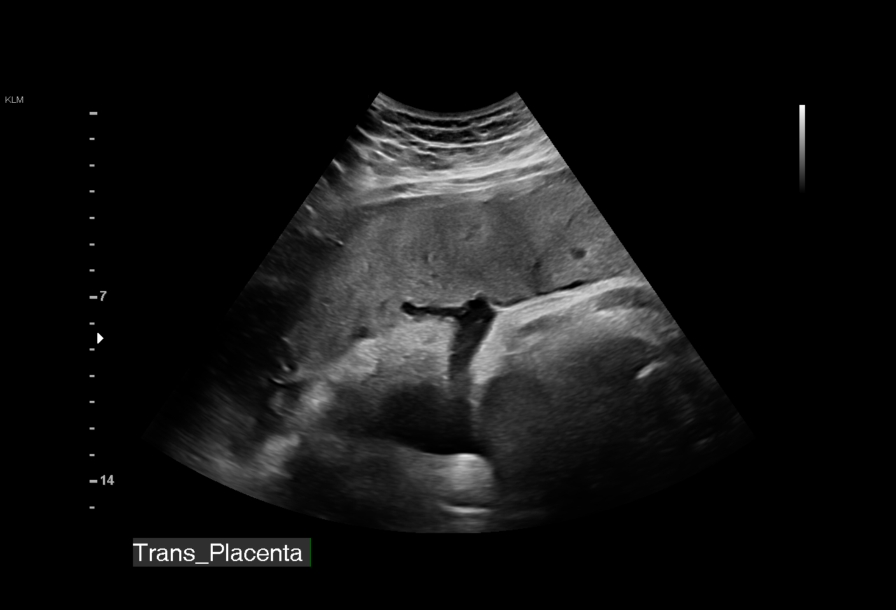
[im 13/23]
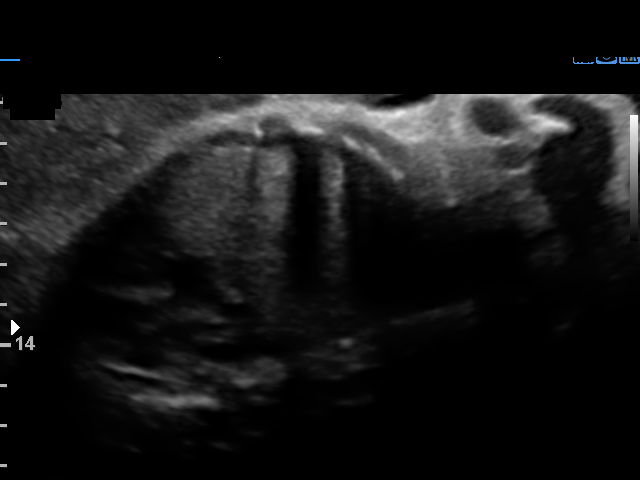
[im 15/23]
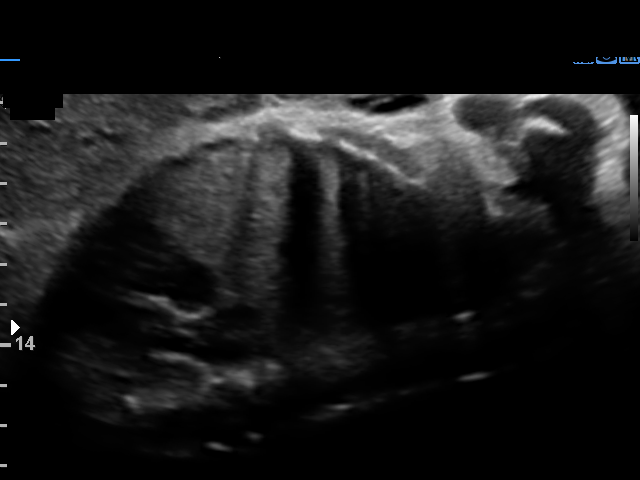
[im 17/23]
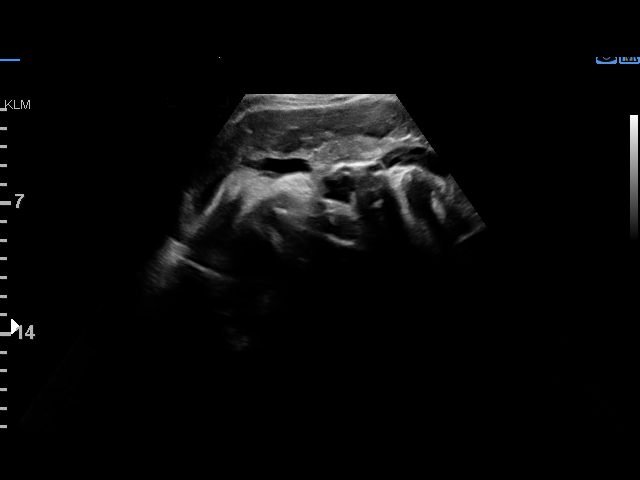
[im 19/23]
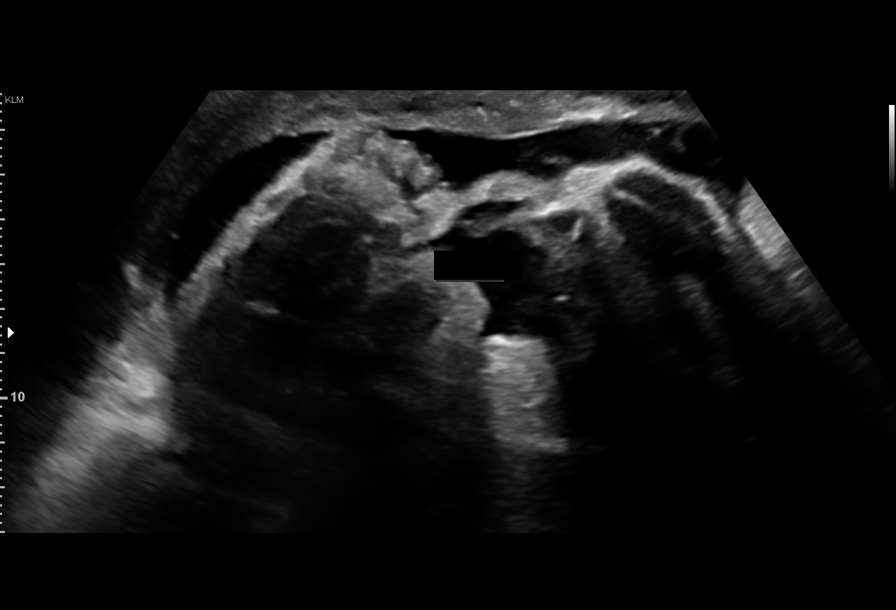
[im 21/23]
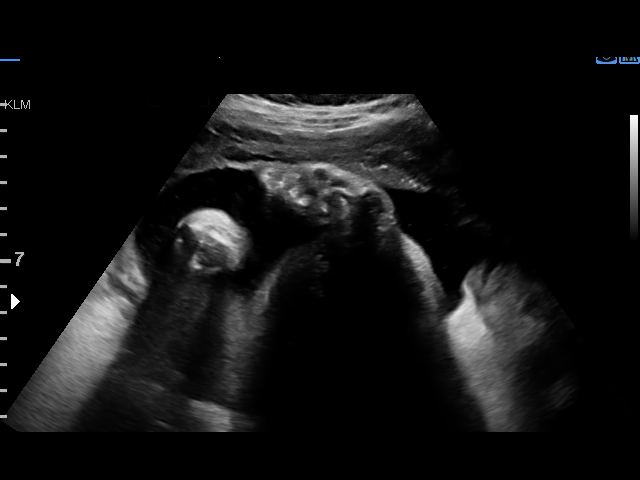
[im 23/23]
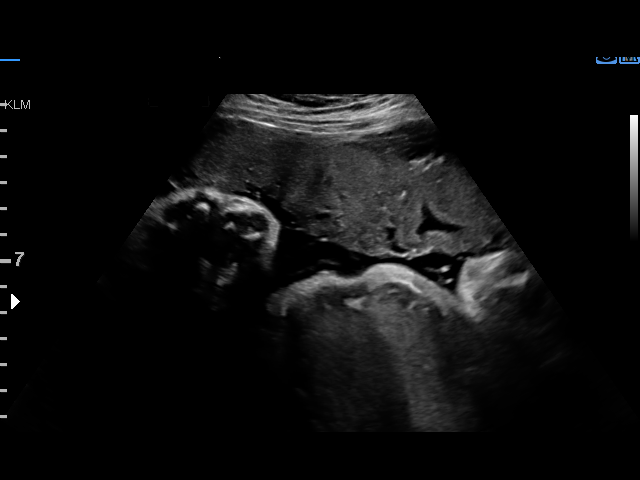

[12 of 23 positions shown; findings below may reference images not displayed]

ZARIAMA CNM

Indications

 Decreased fetal movements, third trimester,
 unspecified
 35 weeks gestation of pregnancy
Fetal Evaluation

 Num Of Fetuses:          1
 Fetal Heart Rate(bpm):   155
 Cardiac Activity:        Observed
 Presentation:            Cephalic
 Placenta:                Anterior

 Amniotic Fluid
 AFI FV:      Within normal limits

 AFI Sum(cm)     %Tile       Largest Pocket(cm)
 9.9             21          3

 RUQ(cm)       RLQ(cm)       LUQ(cm)        LLQ(cm)
 2.7           2.1           3
Biophysical Evaluation

 Amniotic F.V:   Within normal limits       F. Tone:         Observed
 F. Movement:    Observed                   Score:           [DATE]
 F. Breathing:   Observed
Gestational Age

 Best:          35w 5d     Det. By:  U/S  (12/01/19)          EDD:   06/01/20
Anatomy

 Stomach:               Appears normal, left   Bladder:                Appears normal
                        sided
Impression

 Antenatal testing for decreased fetal movement
 Biophysical profile [DATE]
 Good fetal movement and amniotic fluid volume
Recommendations

 Follow up as clinically indicated.

## 2022-06-23 ENCOUNTER — Emergency Department (HOSPITAL_COMMUNITY)
Admission: EM | Admit: 2022-06-23 | Discharge: 2022-06-24 | Disposition: A | Discharge status: Home / Self Care | Payer: Medicaid Other | Attending: Emergency Medicine | Admitting: Emergency Medicine

## 2022-06-23 ENCOUNTER — Emergency Department (HOSPITAL_COMMUNITY): Payer: Medicaid Other

## 2022-06-23 ENCOUNTER — Other Ambulatory Visit: Payer: Self-pay

## 2022-06-23 DIAGNOSIS — D72829 Elevated white blood cell count, unspecified: Secondary | ICD-10-CM | POA: Diagnosis not present

## 2022-06-23 DIAGNOSIS — N898 Other specified noninflammatory disorders of vagina: Secondary | ICD-10-CM | POA: Insufficient documentation

## 2022-06-23 DIAGNOSIS — R1084 Generalized abdominal pain: Secondary | ICD-10-CM

## 2022-06-23 DIAGNOSIS — Z7982 Long term (current) use of aspirin: Secondary | ICD-10-CM | POA: Insufficient documentation

## 2022-06-23 LAB — URINALYSIS, ROUTINE W REFLEX MICROSCOPIC
Bilirubin Urine: NEGATIVE
Glucose, UA: NEGATIVE mg/dL
Hgb urine dipstick: NEGATIVE
Ketones, ur: NEGATIVE mg/dL
Nitrite: NEGATIVE
Protein, ur: NEGATIVE mg/dL
Specific Gravity, Urine: 1.019 (ref 1.005–1.030)
pH: 7 (ref 5.0–8.0)

## 2022-06-23 LAB — I-STAT BETA HCG BLOOD, ED (MC, WL, AP ONLY): I-stat hCG, quantitative: <5 m[IU]/mL (ref <5)

## 2022-06-23 LAB — CBC
HCT: 42.9 % (ref 36.0–46.0)
Hemoglobin: 14.2 g/dL (ref 12.0–15.0)
MCH: 31.9 pg (ref 26.0–34.0)
MCHC: 33.1 g/dL (ref 30.0–36.0)
MCV: 96.4 fL (ref 80.0–100.0)
Platelets: 248 10*3/uL (ref 150–400)
RBC: 4.45 MIL/uL (ref 3.87–5.11)
RDW: 12.9 % (ref 11.5–15.5)
WBC: 18.7 10*3/uL — ABNORMAL HIGH (ref 4.0–10.5)
nRBC: 0 % (ref 0.0–0.2)

## 2022-06-23 LAB — COMPREHENSIVE METABOLIC PANEL
ALT: 14 U/L (ref 0–44)
AST: 17 U/L (ref 15–41)
Albumin: 3.5 g/dL (ref 3.5–5.0)
Alkaline Phosphatase: 78 U/L (ref 38–126)
Anion gap: 7 (ref 5–15)
BUN: 10 mg/dL (ref 6–20)
CO2: 25 mmol/L (ref 22–32)
Calcium: 8.8 mg/dL — ABNORMAL LOW (ref 8.9–10.3)
Chloride: 106 mmol/L (ref 98–111)
Creatinine, Ser: 0.75 mg/dL (ref 0.44–1.00)
GFR, Estimated: >60 mL/min (ref >60)
Glucose, Bld: 116 mg/dL — ABNORMAL HIGH (ref 70–99)
Potassium: 3.7 mmol/L (ref 3.5–5.1)
Sodium: 138 mmol/L (ref 135–145)
Total Bilirubin: 0.5 mg/dL (ref 0.3–1.2)
Total Protein: 6.6 g/dL (ref 6.5–8.1)

## 2022-06-23 LAB — LIPASE, BLOOD: Lipase: 29 U/L (ref 11–51)

## 2022-06-23 LAB — WET PREP, GENITAL
Clue Cells Wet Prep HPF POC: NONE SEEN
Sperm: NONE SEEN
Trich, Wet Prep: NONE SEEN
WBC, Wet Prep HPF POC: >=10 — AB (ref <10)
Yeast Wet Prep HPF POC: NONE SEEN

## 2022-06-23 LAB — PREGNANCY, URINE: Preg Test, Ur: NEGATIVE

## 2022-06-23 MED ORDER — ONDANSETRON HCL 4 MG/2ML IJ SOLN
4.0000 mg | Freq: Once | INTRAMUSCULAR | Status: AC
  Administered 2022-06-23: 4 mg via INTRAVENOUS
  Filled 2022-06-23: qty 2

## 2022-06-23 MED ORDER — IOHEXOL 300 MG/ML  SOLN
100.0000 mL | Freq: Once | INTRAMUSCULAR | Status: AC | PRN
  Administered 2022-06-23: 100 mL via INTRAVENOUS

## 2022-06-23 MED ORDER — MORPHINE SULFATE (PF) 4 MG/ML IV SOLN
4.0000 mg | Freq: Once | INTRAVENOUS | Status: AC
  Administered 2022-06-23: 4 mg via INTRAVENOUS
  Filled 2022-06-23: qty 1

## 2022-06-23 NOTE — ED Notes (Signed)
Patient transported to Ultrasound 

## 2022-06-23 NOTE — ED Triage Notes (Signed)
Pt with lower abdominal "tense feeling" like "contractions"  pt has  had urinary frequency. LMP 2 weeks ago but states it was not her usual period  No fever/chills, vomiting.

## 2022-06-23 NOTE — ED Notes (Signed)
Patient transported to CT 

## 2022-06-23 NOTE — ED Provider Notes (Signed)
[facility] EMERGENCY DEPARTMENT Provider Note   CSN: [id number] Arrival date & time: 06/23/22  1625     History  Chief Complaint  Patient presents with   Abdominal Pain    Rhonda Sparks is a 21 y.o. female with past medical history of preeclampsia and gestational diabetes.  G2 P2-0-0-2.  Presents to the emergency department with a chief complaint of abdominal pain.  Patient reports that she was woken from sleep today with generalized abdominal pain.  Pain is located throughout her entire abdomen and radiates down into her pelvis and back.  Patient describes the pain as "contractions."  Patient states that pain is worse with touch and certain movements.  Patient has not been able to eat today due to her pain.  Patient also endorses urinary frequency and "slight pink," color when wiping after urination.  Patient denies any dysuria however states that when she urinates it "feels like my uterus is heavy."  Patient is sexually active in a mutually monogamous relationship with a female partner.  Patient does not always use condoms with penetrative vaginal intercourse.  Patient is on any forms of birth control.  Patient reports that her last menstrual period was 1 to 2 weeks prior however lasted only 2 to 3 days.  Patient states that her menstrual periods normally last 1 week.  Patient endorses is occasional alcohol and marijuana use.  No recent illicit drug or alcohol use.  Patient denies any history of abdominal surgery.  Patient denies any fever, chills, constipation, diarrhea, blood in stool, melena, dysuria, hematuria, urinary urgency, vaginal pain, vaginal bleeding, vaginal discharge.    Abdominal Pain Associated symptoms: no chest pain, no chills, no constipation, no diarrhea, no dysuria, no fever, no hematuria, no nausea, no shortness of breath, no vaginal bleeding, no vaginal discharge and no vomiting        Home Medications Prior to Admission medications    Medication Sig Start Date End Date Taking? Authorizing Provider  acetaminophen (TYLENOL) 325 MG tablet Take 2 tablets (650 mg total) by mouth every 4 (four) hours as needed (for pain scale < 4). 11/14/21   Mumaw, Hiram Comber, DO  aspirin EC 81 MG tablet Take 1 tablet (81 mg total) by mouth daily. Swallow whole. Take in pregnancy to prevent preeclampsia. 05/02/21   Sheila Oats, MD  ibuprofen (ADVIL) 600 MG tablet Take 1 tablet (600 mg total) by mouth every 6 (six) hours. 11/14/21   Mumaw, Hiram Comber, DO      Allergies    Patient has no known allergies.    Review of Systems   Review of Systems  Constitutional:  Negative for chills and fever.  Eyes:  Negative for visual disturbance.  Respiratory:  Negative for shortness of breath.   Cardiovascular:  Negative for chest pain.  Gastrointestinal:  Positive for abdominal pain. Negative for abdominal distention, anal bleeding, blood in stool, constipation, diarrhea, nausea, rectal pain and vomiting.  Genitourinary:  Positive for frequency, pelvic pain and urgency. Negative for difficulty urinating, dysuria, hematuria, vaginal bleeding, vaginal discharge and vaginal pain.  Musculoskeletal:  Negative for back pain and neck pain.  Skin:  Negative for color change and rash.  Neurological:  Negative for dizziness, syncope, light-headedness and headaches.  Psychiatric/Behavioral:  Negative for confusion.     Physical Exam Updated Vital Signs BP 122/74   Pulse 99   Temp 98.8 F (37.1 C) (Oral)   Resp 16   SpO2 100%  Physical Exam Vitals and  nursing note reviewed. Exam conducted with a chaperone present (Female nurse tech present as chaperone).  Constitutional:      General: She is not in acute distress.    Appearance: She is not ill-appearing, toxic-appearing or diaphoretic.  HENT:     Head: Normocephalic.  Eyes:     General: No scleral icterus.       Right eye: No discharge.        Left eye: No discharge.   Cardiovascular:     Rate and Rhythm: Normal rate.  Pulmonary:     Effort: Pulmonary effort is normal.  Abdominal:     General: Abdomen is flat. Bowel sounds are normal. There is no distension. There are no signs of injury.     Palpations: Abdomen is soft. There is no mass or pulsatile mass.     Tenderness: There is abdominal tenderness. There is left CVA tenderness. There is no right CVA tenderness, guarding or rebound. Negative signs include McBurney's sign.     Hernia: There is no hernia in the umbilical area, ventral area, left inguinal area or right inguinal area.     Comments: Diffuse tenderness throughout abdomen.  Increased tenderness to left lower quadrant and suprapubic area.  Positive left CVA tenderness.  Genitourinary:    Exam position: Lithotomy position.     Pubic Area: No rash or pubic lice.      Tanner stage (genital): 5.     Labia:        Right: No rash, tenderness, lesion or injury.        Left: No rash, tenderness, lesion or injury.      Vagina: No signs of injury and foreign body. Vaginal discharge and tenderness present. No erythema, bleeding, lesions or prolapsed vaginal walls.     Cervix: No cervical motion tenderness, discharge, friability, lesion, erythema, cervical bleeding or eversion.     Uterus: Tender. Not enlarged.      Adnexa:        Right: Tenderness present. No mass or fullness.         Left: Tenderness present. No mass.       Comments: Tenderness to vaginal vault upon insertion of speculum.  Moderate amount of yellowish vaginal discharge within vaginal vault.  Cervix is unremarkable with no cervical motion tenderness.  Patient has diffuse tenderness to uterus and adnexa bilaterally. Lymphadenopathy:     Lower Body: No right inguinal adenopathy. No left inguinal adenopathy.  Skin:    General: Skin is warm and dry.  Neurological:     General: No focal deficit present.     Mental Status: She is alert.     GCS: GCS eye subscore is 4. GCS verbal subscore  is 5. GCS motor subscore is 6.  Psychiatric:        Behavior: Behavior is cooperative.     ED Results / Procedures / Treatments   Labs (all labs ordered are listed, but only abnormal results are displayed) Labs Reviewed  WET PREP, GENITAL - Abnormal; Notable for the following components:      Result Value   WBC, Wet Prep HPF POC >=10 (*)    All other components within normal limits  COMPREHENSIVE METABOLIC PANEL - Abnormal; Notable for the following components:   Glucose, Bld 116 (*)    Calcium 8.8 (*)    All other components within normal limits  CBC - Abnormal; Notable for the following components:   WBC 18.7 (*)    All other components within  normal limits  URINALYSIS, ROUTINE W REFLEX MICROSCOPIC - Abnormal; Notable for the following components:   APPearance HAZY (*)    Leukocytes,Ua SMALL (*)    Bacteria, UA RARE (*)    All other components within normal limits  LIPASE, BLOOD  PREGNANCY, URINE  I-STAT BETA HCG BLOOD, ED ([facility], [facility], [facility] ONLY)  GC/CHLAMYDIA PROBE AMP ([place]) NOT AT [facility]    EKG None  Radiology CT ABDOMEN PELVIS W CONTRAST  Result Date: 06/23/2022 CLINICAL DATA:  Left lower quadrant pain EXAM: CT ABDOMEN AND PELVIS WITH CONTRAST TECHNIQUE: Multidetector CT imaging of the abdomen and pelvis was performed using the standard protocol following bolus administration of intravenous contrast. RADIATION DOSE REDUCTION: This exam was performed according to the departmental dose-optimization program which includes automated exposure control, adjustment of the mA and/or kV according to patient size and/or use of iterative reconstruction technique. CONTRAST:  150mL OMNIPAQUE IOHEXOL 300 MG/ML  SOLN COMPARISON:  None Available. FINDINGS: Lower chest: No acute abnormality Hepatobiliary: No focal hepatic abnormality. Gallbladder unremarkable. Pancreas: No focal abnormality or ductal dilatation. Spleen: No focal abnormality.  Normal size. Adrenals/Urinary Tract: No adrenal  abnormality. No focal renal abnormality. No stones or hydronephrosis. Urinary bladder is unremarkable. Stomach/Bowel: Normal appendix. Stomach, large and small bowel grossly unremarkable. Vascular/Lymphatic: No evidence of aneurysm or adenopathy. Reproductive: Uterus and adnexa unremarkable.  No mass. Other: No free fluid or free air. Musculoskeletal: No acute bony abnormality. IMPRESSION: No acute findings in the abdomen or pelvis. Electronically Signed   By: Rolm Baptise M.D.   On: 06/23/2022 23:14   US Pelvis Complete  Result Date: 06/23/2022 CLINICAL DATA:  Left-sided pelvic pain EXAM: TRANSABDOMINAL AND TRANSVAGINAL ULTRASOUND OF PELVIS DOPPLER ULTRASOUND OF OVARIES TECHNIQUE: Both transabdominal and transvaginal ultrasound examinations of the pelvis were performed. Transabdominal technique was performed for global imaging of the pelvis including uterus, ovaries, adnexal regions, and pelvic cul-de-sac. It was necessary to proceed with endovaginal exam following the transabdominal exam to visualize the endometrium and adnexal structures. Color and duplex Doppler ultrasound was utilized to evaluate blood flow to the ovaries. COMPARISON:  None Available. FINDINGS: Uterus Measurements: 7.7 x 4.7 x 5.7 cm = volume: 108.6 mL. No fibroids or other masses visualized. Incidental nabothian cyst within the cervix. Endometrium Thickness: 8 mm.  No focal abnormality visualized. Right ovary Measurements: 4.8 x 3.1 x 3.4 cm = volume: 26.7 mL. Simple 2.9 by 1.9 by 2.3 cm cyst or follicle. Otherwise the right ovary is unremarkable. Left ovary Measurements: 3.0 x 2.5 x 1.8 cm = volume: 7.0 mL. Normal appearance/no adnexal mass. Pulsed Doppler evaluation of both ovaries demonstrates normal low-resistance arterial and venous waveforms. Other findings No abnormal free fluid. IMPRESSION: 1. Simple appearing 2.9 cm left ovarian cyst. No followup imaging recommended. Note: This recommendation does not apply to premenarchal patients  or to those with increased risk (genetic, family history, elevated tumor markers or other high-risk factors) of ovarian cancer. Reference: Radiology 2019 Nov; 293(2):359-371. 2. Otherwise unremarkable pelvic ultrasound. Electronically Signed   By: Randa Ngo M.D.   On: 06/23/2022 21:41   US Transvaginal Non-OB  Result Date: 06/23/2022 CLINICAL DATA:  Left-sided pelvic pain EXAM: TRANSABDOMINAL AND TRANSVAGINAL ULTRASOUND OF PELVIS DOPPLER ULTRASOUND OF OVARIES TECHNIQUE: Both transabdominal and transvaginal ultrasound examinations of the pelvis were performed. Transabdominal technique was performed for global imaging of the pelvis including uterus, ovaries, adnexal regions, and pelvic cul-de-sac. It was necessary to proceed with endovaginal exam following the transabdominal exam to visualize the endometrium  and adnexal structures. Color and duplex Doppler ultrasound was utilized to evaluate blood flow to the ovaries. COMPARISON:  None Available. FINDINGS: Uterus Measurements: 7.7 x 4.7 x 5.7 cm = volume: 108.6 mL. No fibroids or other masses visualized. Incidental nabothian cyst within the cervix. Endometrium Thickness: 8 mm.  No focal abnormality visualized. Right ovary Measurements: 4.8 x 3.1 x 3.4 cm = volume: 26.7 mL. Simple 2.9 by 1.9 by 2.3 cm cyst or follicle. Otherwise the right ovary is unremarkable. Left ovary Measurements: 3.0 x 2.5 x 1.8 cm = volume: 7.0 mL. Normal appearance/no adnexal mass. Pulsed Doppler evaluation of both ovaries demonstrates normal low-resistance arterial and venous waveforms. Other findings No abnormal free fluid. IMPRESSION: 1. Simple appearing 2.9 cm left ovarian cyst. No followup imaging recommended. Note: This recommendation does not apply to premenarchal patients or to those with increased risk (genetic, family history, elevated tumor markers or other high-risk factors) of ovarian cancer. Reference: Radiology 2019 Nov; 293(2):359-371. 2. Otherwise unremarkable pelvic  ultrasound. Electronically Signed   By: Sharlet Salina M.D.   On: 06/23/2022 21:41   [place] Art/Ven Flow Abd Pelv Doppler  Result Date: 06/23/2022 CLINICAL DATA:  Left-sided pelvic pain EXAM: TRANSABDOMINAL AND TRANSVAGINAL ULTRASOUND OF PELVIS DOPPLER ULTRASOUND OF OVARIES TECHNIQUE: Both transabdominal and transvaginal ultrasound examinations of the pelvis were performed. Transabdominal technique was performed for global imaging of the pelvis including uterus, ovaries, adnexal regions, and pelvic cul-de-sac. It was necessary to proceed with endovaginal exam following the transabdominal exam to visualize the endometrium and adnexal structures. Color and duplex Doppler ultrasound was utilized to evaluate blood flow to the ovaries. COMPARISON:  None Available. FINDINGS: Uterus Measurements: 7.7 x 4.7 x 5.7 cm = volume: 108.6 mL. No fibroids or other masses visualized. Incidental nabothian cyst within the cervix. Endometrium Thickness: 8 mm.  No focal abnormality visualized. Right ovary Measurements: 4.8 x 3.1 x 3.4 cm = volume: 26.7 mL. Simple 2.9 by 1.9 by 2.3 cm cyst or follicle. Otherwise the right ovary is unremarkable. Left ovary Measurements: 3.0 x 2.5 x 1.8 cm = volume: 7.0 mL. Normal appearance/no adnexal mass. Pulsed Doppler evaluation of both ovaries demonstrates normal low-resistance arterial and venous waveforms. Other findings No abnormal free fluid. IMPRESSION: 1. Simple appearing 2.9 cm left ovarian cyst. No followup imaging recommended. Note: This recommendation does not apply to premenarchal patients or to those with increased risk (genetic, family history, elevated tumor markers or other high-risk factors) of ovarian cancer. Reference: Radiology 2019 Nov; 293(2):359-371. 2. Otherwise unremarkable pelvic ultrasound. Electronically Signed   By: Sharlet Salina M.D.   On: 06/23/2022 21:41    Procedures Procedures    Medications Ordered in ED Medications  morphine (PF) 4 MG/ML injection 4 mg (4 mg  Intravenous Given 06/23/22 1942)  ondansetron (ZOFRAN) injection 4 mg (4 mg Intravenous Given 06/23/22 1940)  iohexol (OMNIPAQUE) 300 MG/ML solution 100 mL (100 mLs Intravenous Contrast Given 06/23/22 2257)    ED Course/ Medical Decision Making/ A&P                           Medical Decision Making Amount and/or Complexity of Data Reviewed Labs: ordered. Radiology: ordered.  Risk Prescription drug management.   Alert 21 year old female in no acute distress, nontoxic-appearing.  Presents to the emergency department the chief complaint of abdominal pain.  Information was obtained from patient.  I reviewed patient's past medical records including previous provider notes, labs and imaging.  With reports of  sudden onset lower abdominal/pelvic pain concern for possible ovarian torsion.  Will obtain ultrasound imaging for further evaluation.  Abdominal labs ordered.  Patient given dose of morphine and Zofran for pain management.  Personally viewed and interpreted patient's lab results.  Pertinent findings include: -Leukocytosis 18.7 -CMP unremarkable -Urine pregnancy test negative -Urinalysis shows bacteria rare, WBC 21-50, leukocyte small, nitrite negative, squamous epithelial cells 11-20.   Ultrasound imaging shows 2.9 cm left ovarian cyst.  No other acute findings found.  With tenderness to left lower quadrant and suprapubic area concern for possible acute diverticulitis, will obtain x-ray imaging for further evaluation.  I personally viewed and interpreted patient CT imaging.  Agree with radiology interpretation of no acute abnormality within abdomen/pelvis.  While patient denies any vaginal complaints will obtain gonorrhea/committee testing as well as wet prep due to lower abdominal/pelvic pain in setting of leukocytosis.  I personally viewed and interpreted patient's wet prep.  Wet prep shows no signs of trichomonas, bacterial vaginosis, or yeast infection.  Shared decision making with  patient about empiric treatment for gonorrhea and chlamydia due to vaginal discharge seen.  Patient declines empiric treatment at this time.  Discussed importance of follow-up for treatment if contacted for positive results.  On serial reexamination abdomen soft, nondistended, with improvement in tenderness.  Patient is able to tolerate p.o. intake without difficulty.  Will discharge patient at this time.  Strict return precautions given.  Patient care discussed with attending physician Dr. Ernesto Rutherford.  Based on patient's chief complaint, I considered admission might be necessary, however after reassuring ED workup feel patient is reasonable for discharge.  Discussed results, findings, treatment and follow up. Patient advised of return precautions. Patient verbalized understanding and agreed with plan.  Portions of this note were generated with [employer]. Dictation errors may occur despite best attempts at proofreading.          Final Clinical Impression(s) / ED Diagnoses Final diagnoses:  None    Rx / DC Orders ED Discharge Orders     None         Loni Beckwith, PA-C 06/24/22 0049    Ottie Glazier, DO 06/24/22 1459

## 2022-06-24 NOTE — Discharge Instructions (Addendum)
Today you have been tested for gonorrhea and chlamydia.  The test to determine if you have these will take a few days. They will only call you if your tests come back positive, no news is good news. In the result that your tests are positive  please got to [facility] department, your primary care doctor, or urgent care to start treatment.  Please do not have any sexual activity for the next two weeks.  After that please make sure that you always use a condom every time you have sex.    Please take Ibuprofen (Advil, motrin) and Tylenol (acetaminophen) to relieve your pain.    You may take up to 600 MG (3 pills) of normal strength ibuprofen every 8 hours as needed.   You make take tylenol, up to 1,000 mg (two extra strength pills) every 8 hours as needed.   It is safe to take ibuprofen and tylenol at the same time as they work differently.   Do not take more than 3,000 mg tylenol in a 24 hour period (not more than one dose every 8 hours.  Please check all medication labels as many medications such as pain and cold medications may contain tylenol.  Do not drink alcohol while taking these medications.  Do not take other NSAID'S while taking ibuprofen (such as aleve or naproxen).  Please take ibuprofen with food to decrease stomach upset.   Please return to the emergency department if you develop any new or worsening abdominal pain, develop fevers, develop vomiting, have a sudden onset of pelvic pain.

## 2022-06-24 NOTE — ED Notes (Signed)
RN reviewed discharge instructions w/ pt. Follow up and pain management reviewed. Pt brought to lobby via wheelchair per pt request.

## 2022-06-26 LAB — GC/CHLAMYDIA PROBE AMP (~~LOC~~) NOT AT ARMC
Comment: NEGATIVE
Comment: NORMAL

## 2023-01-06 ENCOUNTER — Other Ambulatory Visit: Payer: Self-pay

## 2023-01-06 ENCOUNTER — Encounter (HOSPITAL_BASED_OUTPATIENT_CLINIC_OR_DEPARTMENT_OTHER): Payer: Self-pay | Admitting: Emergency Medicine

## 2023-01-06 ENCOUNTER — Emergency Department (HOSPITAL_BASED_OUTPATIENT_CLINIC_OR_DEPARTMENT_OTHER): Payer: Medicaid Other

## 2023-01-06 ENCOUNTER — Inpatient Hospital Stay (HOSPITAL_BASED_OUTPATIENT_CLINIC_OR_DEPARTMENT_OTHER)
Admission: EM | Admit: 2023-01-06 | Discharge: 2023-01-09 | DRG: 758 | Disposition: A | Discharge status: Home / Self Care | Payer: Medicaid Other | Attending: Obstetrics and Gynecology | Admitting: Obstetrics and Gynecology

## 2023-01-06 DIAGNOSIS — Z818 Family history of other mental and behavioral disorders: Secondary | ICD-10-CM | POA: Diagnosis not present

## 2023-01-06 DIAGNOSIS — Z87891 Personal history of nicotine dependence: Secondary | ICD-10-CM | POA: Diagnosis not present

## 2023-01-06 DIAGNOSIS — Z83438 Family history of other disorder of lipoprotein metabolism and other lipidemia: Secondary | ICD-10-CM | POA: Diagnosis not present

## 2023-01-06 DIAGNOSIS — Z833 Family history of diabetes mellitus: Secondary | ICD-10-CM

## 2023-01-06 DIAGNOSIS — A549 Gonococcal infection, unspecified: Secondary | ICD-10-CM | POA: Diagnosis present

## 2023-01-06 DIAGNOSIS — N730 Acute parametritis and pelvic cellulitis: Secondary | ICD-10-CM | POA: Diagnosis present

## 2023-01-06 DIAGNOSIS — N739 Female pelvic inflammatory disease, unspecified: Secondary | ICD-10-CM | POA: Diagnosis present

## 2023-01-06 LAB — COMPREHENSIVE METABOLIC PANEL
ALT: 19 U/L (ref 0–44)
AST: 25 U/L (ref 15–41)
Albumin: 4.2 g/dL (ref 3.5–5.0)
Alkaline Phosphatase: 83 U/L (ref 38–126)
Anion gap: 7 (ref 5–15)
BUN: 11 mg/dL (ref 6–20)
CO2: 21 mmol/L — ABNORMAL LOW (ref 22–32)
Calcium: 9.1 mg/dL (ref 8.9–10.3)
Chloride: 102 mmol/L (ref 98–111)
Creatinine, Ser: 0.72 mg/dL (ref 0.44–1.00)
GFR, Estimated: >60 mL/min (ref >60)
Glucose, Bld: 129 mg/dL — ABNORMAL HIGH (ref 70–99)
Potassium: 3.6 mmol/L (ref 3.5–5.1)
Sodium: 130 mmol/L — ABNORMAL LOW (ref 135–145)
Total Bilirubin: 1.7 mg/dL — ABNORMAL HIGH (ref 0.3–1.2)
Total Protein: 8.2 g/dL — ABNORMAL HIGH (ref 6.5–8.1)

## 2023-01-06 LAB — URINALYSIS, ROUTINE W REFLEX MICROSCOPIC
Bilirubin Urine: NEGATIVE
Glucose, UA: NEGATIVE mg/dL
Ketones, ur: NEGATIVE mg/dL
Nitrite: NEGATIVE
Protein, ur: 30 mg/dL — AB
Specific Gravity, Urine: 1.025 (ref 1.005–1.030)
pH: 7 (ref 5.0–8.0)

## 2023-01-06 LAB — CBC
HCT: 39.4 % (ref 36.0–46.0)
Hemoglobin: 13.3 g/dL (ref 12.0–15.0)
MCH: 32.3 pg (ref 26.0–34.0)
MCHC: 33.8 g/dL (ref 30.0–36.0)
MCV: 95.6 fL (ref 80.0–100.0)
Platelets: 313 10*3/uL (ref 150–400)
RBC: 4.12 MIL/uL (ref 3.87–5.11)
RDW: 13 % (ref 11.5–15.5)
WBC: 25.1 10*3/uL — ABNORMAL HIGH (ref 4.0–10.5)
nRBC: 0 % (ref 0.0–0.2)

## 2023-01-06 LAB — URINALYSIS, MICROSCOPIC (REFLEX): WBC, UA: >50 WBC/hpf (ref 0–5)

## 2023-01-06 LAB — WET PREP, GENITAL
Sperm: NONE SEEN
Trich, Wet Prep: NONE SEEN
WBC, Wet Prep HPF POC: >=10 — AB
Yeast Wet Prep HPF POC: NONE SEEN

## 2023-01-06 LAB — LACTIC ACID, PLASMA: Lactic Acid, Venous: 1.1 mmol/L (ref 0.5–1.9)

## 2023-01-06 LAB — PREGNANCY, URINE: Preg Test, Ur: NEGATIVE

## 2023-01-06 LAB — HIV ANTIBODY (ROUTINE TESTING W REFLEX): HIV Screen 4th Generation wRfx: NONREACTIVE

## 2023-01-06 LAB — LIPASE, BLOOD: Lipase: 25 U/L (ref 11–51)

## 2023-01-06 MED ORDER — METRONIDAZOLE 500 MG PO TABS: 500.0000 mg | ORAL_TABLET | Freq: Two times a day (BID) | ORAL | Status: DC

## 2023-01-06 MED ORDER — SODIUM CHLORIDE 0.9 % IV BOLUS
1000.0000 mL | Freq: Once | INTRAVENOUS | Status: AC
  Administered 2023-01-06: 1000 mL via INTRAVENOUS

## 2023-01-06 MED ORDER — ACETAMINOPHEN 325 MG PO TABS
650.0000 mg | ORAL_TABLET | Freq: Once | ORAL | Status: AC
  Administered 2023-01-06: 650 mg via ORAL
  Filled 2023-01-06: qty 2

## 2023-01-06 MED ORDER — MORPHINE SULFATE (PF) 4 MG/ML IV SOLN
4.0000 mg | Freq: Once | INTRAVENOUS | Status: AC
  Administered 2023-01-06: 4 mg via INTRAVENOUS
  Filled 2023-01-06: qty 1

## 2023-01-06 MED ORDER — ONDANSETRON HCL 4 MG/2ML IJ SOLN
4.0000 mg | Freq: Once | INTRAMUSCULAR | Status: AC
  Administered 2023-01-06: 4 mg via INTRAVENOUS
  Filled 2023-01-06: qty 2

## 2023-01-06 MED ORDER — ONDANSETRON HCL 4 MG/2ML IJ SOLN
4.0000 mg | Freq: Four times a day (QID) | INTRAMUSCULAR | Status: DC | PRN
  Administered 2023-01-08 (×3): 4 mg via INTRAVENOUS
  Filled 2023-01-06 (×3): qty 2

## 2023-01-06 MED ORDER — SODIUM CHLORIDE 0.9 % IV SOLN
2.0000 g | Freq: Every day | INTRAVENOUS | Status: DC
  Administered 2023-01-07 – 2023-01-09 (×3): 2 g via INTRAVENOUS
  Filled 2023-01-06 (×3): qty 20

## 2023-01-06 MED ORDER — SODIUM CHLORIDE 0.9 % IV SOLN: 1.0000 g | INTRAVENOUS | Status: DC

## 2023-01-06 MED ORDER — IOHEXOL 300 MG/ML  SOLN
100.0000 mL | Freq: Once | INTRAMUSCULAR | Status: AC | PRN
  Administered 2023-01-06: 100 mL via INTRAVENOUS

## 2023-01-06 MED ORDER — SODIUM CHLORIDE 0.9 % IV SOLN
1.0000 g | Freq: Once | INTRAVENOUS | Status: AC
  Administered 2023-01-06: 1 g via INTRAVENOUS
  Filled 2023-01-06: qty 10

## 2023-01-06 MED ORDER — HYDROMORPHONE HCL 1 MG/ML IJ SOLN
1.0000 mg | INTRAMUSCULAR | Status: AC | PRN
  Administered 2023-01-07 (×3): 1 mg via INTRAVENOUS
  Filled 2023-01-06 (×4): qty 1

## 2023-01-06 MED ORDER — SODIUM CHLORIDE 0.9 % IV SOLN: INTRAVENOUS | Status: AC

## 2023-01-06 MED ORDER — DOXYCYCLINE HYCLATE 100 MG PO TABS
100.0000 mg | ORAL_TABLET | Freq: Once | ORAL | Status: AC
  Administered 2023-01-06: 100 mg via ORAL
  Filled 2023-01-06: qty 1

## 2023-01-06 MED ORDER — DOXYCYCLINE HYCLATE 100 MG PO TABS
100.0000 mg | ORAL_TABLET | Freq: Two times a day (BID) | ORAL | Status: DC
  Administered 2023-01-07 – 2023-01-09 (×6): 100 mg via ORAL
  Filled 2023-01-06 (×6): qty 1

## 2023-01-06 MED ORDER — ENOXAPARIN SODIUM 40 MG/0.4ML IJ SOSY
40.0000 mg | PREFILLED_SYRINGE | INTRAMUSCULAR | Status: DC
  Administered 2023-01-06 – 2023-01-08 (×3): 40 mg via SUBCUTANEOUS
  Filled 2023-01-06 (×3): qty 0.4

## 2023-01-06 MED ORDER — ONDANSETRON HCL 8 MG PO TABS
4.0000 mg | ORAL_TABLET | Freq: Four times a day (QID) | ORAL | Status: DC | PRN
  Filled 2023-01-06: qty 0.5

## 2023-01-06 MED ORDER — LACTATED RINGERS IV BOLUS (SEPSIS)
1000.0000 mL | Freq: Once | INTRAVENOUS | Status: AC
  Administered 2023-01-07: 1000 mL via INTRAVENOUS

## 2023-01-06 MED ORDER — METRONIDAZOLE 500 MG/100ML IV SOLN: 500.0000 mg | Freq: Two times a day (BID) | INTRAVENOUS | Status: DC

## 2023-01-06 MED ORDER — METRONIDAZOLE 500 MG PO TABS
500.0000 mg | ORAL_TABLET | Freq: Two times a day (BID) | ORAL | Status: DC
  Administered 2023-01-07 – 2023-01-09 (×6): 500 mg via ORAL
  Filled 2023-01-06 (×6): qty 1

## 2023-01-06 MED ORDER — METRONIDAZOLE 500 MG/100ML IV SOLN
500.0000 mg | Freq: Once | INTRAVENOUS | Status: AC
  Administered 2023-01-06: 500 mg via INTRAVENOUS
  Filled 2023-01-06: qty 100

## 2023-01-06 MED ORDER — SENNOSIDES-DOCUSATE SODIUM 8.6-50 MG PO TABS: 1.0000 | ORAL_TABLET | Freq: Every evening | ORAL | Status: DC | PRN

## 2023-01-06 MED ORDER — LACTATED RINGERS IV BOLUS (SEPSIS)
500.0000 mL | Freq: Once | INTRAVENOUS | Status: AC
  Administered 2023-01-07: 500 mL via INTRAVENOUS

## 2023-01-06 MED ORDER — SODIUM CHLORIDE 0.9 % IV SOLN: Freq: Once | INTRAVENOUS | Status: AC

## 2023-01-06 MED ORDER — SIMETHICONE 80 MG PO CHEW
80.0000 mg | CHEWABLE_TABLET | Freq: Four times a day (QID) | ORAL | Status: DC | PRN
  Filled 2023-01-06: qty 1

## 2023-01-06 MED ORDER — OXYCODONE HCL 5 MG PO TABS
5.0000 mg | ORAL_TABLET | Freq: Four times a day (QID) | ORAL | Status: DC | PRN
  Administered 2023-01-06: 10 mg via ORAL
  Administered 2023-01-07: 5 mg via ORAL
  Administered 2023-01-07 – 2023-01-08 (×2): 10 mg via ORAL
  Filled 2023-01-06: qty 2
  Filled 2023-01-06: qty 1
  Filled 2023-01-06 (×2): qty 2

## 2023-01-06 MED ORDER — DOXYCYCLINE HYCLATE 100 MG PO TABS: 100.0000 mg | ORAL_TABLET | Freq: Two times a day (BID) | ORAL | Status: DC

## 2023-01-06 NOTE — Progress Notes (Signed)
Patient arrived to floor from ED, alert and oriented, DX: Acute PID. Abd pain 5 on a numeric scale, pain been managed in ED Right before tx to floor, denies nausea at time. Yellow on the MEWS at time; protocol initiated,

## 2023-01-06 NOTE — ED Notes (Signed)
Pt remain in ultrasound
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
## 2023-01-06 NOTE — ED Notes (Signed)
Pt in bed, pt reports 5/10 pain, pt states that she doesn't need anything for pain at this time .

## 2023-01-06 NOTE — ED Notes (Signed)
Pt to ct 

## 2023-01-06 NOTE — ED Triage Notes (Signed)
Pt from home via EMS with c/o abdominal pain with N/V x 24 hours. Pt has history of PCOS.

## 2023-01-06 NOTE — ED Notes (Signed)
Called [facility] for transport at 6:38.

## 2023-01-06 NOTE — ED Provider Notes (Signed)
EMERGENCY DEPARTMENT AT [facility] Provider Note   CSN: [id number] Arrival date & time: 01/06/23  1137     History  Chief Complaint  Patient presents with   Abdominal Pain    Rhonda Sparks is a 22 y.o. female.  22 year old female presents with complaint of left lower quadrant abdominal pain onset at 2 AM today associated with nausea, vomiting, chills and abnormal vaginal discharge.  Reports prior history of chlamydia several years ago that was treated.  Does have concerns for potential STI today.  Denies changes in bowel or bladder habits.  No prior abdominal surgeries.       Home Medications Prior to Admission medications   Medication Sig Start Date End Date Taking? Authorizing Provider  acetaminophen (TYLENOL) 325 MG tablet Take 2 tablets (650 mg total) by mouth every 4 (four) hours as needed (for pain scale < 4). 11/14/21   Mumaw, Lauralyn Primes, DO  aspirin EC 81 MG tablet Take 1 tablet (81 mg total) by mouth daily. Swallow whole. Take in pregnancy to prevent preeclampsia. 05/02/21   Randa Ngo, MD  ibuprofen (ADVIL) 600 MG tablet Take 1 tablet (600 mg total) by mouth every 6 (six) hours. 11/14/21   Mumaw, Lauralyn Primes, DO      Allergies    Patient has no known allergies.    Review of Systems   Review of Systems Negative except as per HPI Physical Exam Updated Vital Signs BP 106/62 (BP Location: Right Arm)   Pulse (!) 115   Temp 99.5 F (37.5 C) (Oral)   Resp 18   Ht 5' 1"$  (1.549 m)   Wt 75.8 kg   LMP 01/06/2023   SpO2 96%   Breastfeeding No   BMI 31.55 kg/m  Physical Exam Vitals and nursing note reviewed. Exam conducted with a chaperone present.  Constitutional:      General: She is not in acute distress.    Appearance: She is well-developed. She is not diaphoretic.  HENT:     Head: Normocephalic and atraumatic.  Cardiovascular:     Rate and Rhythm: Regular rhythm. Tachycardia present.     Heart sounds: Normal  heart sounds.  Pulmonary:     Effort: Pulmonary effort is normal.     Breath sounds: Normal breath sounds.  Abdominal:     Palpations: Abdomen is soft.     Tenderness: There is generalized abdominal tenderness. There is guarding.  Genitourinary:    Cervix: Cervical motion tenderness present.     Uterus: Tender.      Adnexa:        Right: Tenderness present.        Left: Tenderness present.      Comments: Reddish/grey watery vaginal discharge Skin:    General: Skin is warm and dry.     Findings: No erythema or rash.  Neurological:     Mental Status: She is alert and oriented to person, place, and time.  Psychiatric:        Behavior: Behavior normal.     ED Results / Procedures / Treatments   Labs (all labs ordered are listed, but only abnormal results are displayed) Labs Reviewed  WET PREP, GENITAL - Abnormal; Notable for the following components:      Result Value   Clue Cells Wet Prep HPF POC PRESENT (*)    WBC, Wet Prep HPF POC >=10 (*)    All other components within normal limits  COMPREHENSIVE METABOLIC PANEL - Abnormal; Notable  for the following components:   Sodium 130 (*)    CO2 21 (*)    Glucose, Bld 129 (*)    Total Protein 8.2 (*)    Total Bilirubin 1.7 (*)    All other components within normal limits  CBC - Abnormal; Notable for the following components:   WBC 25.1 (*)    All other components within normal limits  URINALYSIS, ROUTINE W REFLEX MICROSCOPIC - Abnormal; Notable for the following components:   Color, Urine AMBER (*)    APPearance CLOUDY (*)    Hgb urine dipstick MODERATE (*)    Protein, ur 30 (*)    Leukocytes,Ua SMALL (*)    All other components within normal limits  URINALYSIS, MICROSCOPIC (REFLEX) - Abnormal; Notable for the following components:   Bacteria, UA MANY (*)    All other components within normal limits  CULTURE, BLOOD (ROUTINE X 2)  CULTURE, BLOOD (ROUTINE X 2)  LIPASE, BLOOD  PREGNANCY, URINE  LACTIC ACID, PLASMA  RPR   HIV ANTIBODY (ROUTINE TESTING W REFLEX)  CBC WITH DIFFERENTIAL/PLATELET  GC/CHLAMYDIA PROBE AMP ([place]) NOT AT [facility]    EKG None  Radiology CT Abdomen Pelvis W Contrast  Result Date: 01/06/2023 CLINICAL DATA:  Abdominal pain, nausea and vomiting for 24 hours, history of polycystic ovarian syndrome EXAM: CT ABDOMEN AND PELVIS WITH CONTRAST TECHNIQUE: Multidetector CT imaging of the abdomen and pelvis was performed using the standard protocol following bolus administration of intravenous contrast. RADIATION DOSE REDUCTION: This exam was performed according to the departmental dose-optimization program which includes automated exposure control, adjustment of the mA and/or kV according to patient size and/or use of iterative reconstruction technique. CONTRAST:  136m OMNIPAQUE IOHEXOL 300 MG/ML  SOLN COMPARISON:  01/06/2023, 06/23/2022 FINDINGS: Lower chest: No acute pleural or parenchymal lung disease. Hepatobiliary: No focal liver abnormality is seen. No gallstones, gallbladder wall thickening, or biliary dilatation. Pancreas: Unremarkable. No pancreatic ductal dilatation or surrounding inflammatory changes. Spleen: Normal in size without focal abnormality. Adrenals/Urinary Tract: Adrenal glands are unremarkable. Kidneys are normal, without renal calculi, focal lesion, or hydronephrosis. Bladder is unremarkable. Stomach/Bowel: No bowel obstruction or ileus. Normal appendix right lower quadrant. No bowel wall thickening or inflammatory change. Vascular/Lymphatic: No significant vascular findings are present. No enlarged abdominal or pelvic lymph nodes. Reproductive: Age-appropriate appearance of the uterus and adnexal structures. Other: Trace pelvic free fluid is nonspecific. No free intraperitoneal gas. No abdominal wall hernia. Musculoskeletal: No acute or destructive bony lesions. 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 Reconstructed images demonstrate no  additional findings. IMPRESSION: 1. Trace nonspecific pelvic free fluid, likely physiologic. 2. Otherwise no acute intra-abdominal or intrapelvic process. Normal appendix. Electronically Signed   By: MRanda NgoM.D.   On: 01/06/2023 17:05   [place]PELVIC COMPLETE WITH TRANSVAGINAL  Result Date: 01/06/2023 CLINICAL DATA:  Pelvic pain.  Rule out abscess, PID. EXAM: TRANSABDOMINAL AND TRANSVAGINAL ULTRASOUND OF PELVIS TECHNIQUE: Both transabdominal and transvaginal ultrasound examinations of the pelvis were performed. Transabdominal technique was performed for global imaging of the pelvis including uterus, ovaries, adnexal regions, and pelvic cul-de-sac. It was necessary to proceed with endovaginal exam following the transabdominal exam to visualize the uterus, endometrium and ovaries to better advantage. COMPARISON:  None Available. FINDINGS: Uterus Measurements: 7.7 x 4.4 x 5.1 cm = volume: 89.4 mL. No fibroids or other mass visualized. Endometrium Thickness: 9 mm. No focal lesion or mass. Some movement within the endometrial canal. Patient currently menstruating. Right  ovary Measurements: 3.4 x 1.8 x 1.7 cm = volume: 5.5 mL. Normal appearance/no adnexal mass. Left ovary Measurements: 4.0 x 2.3 x 2.6 cm = volume: 12.1 mL. Normal appearance/no adnexal mass. Other findings No abnormal free fluid. IMPRESSION: 1. Normal exam. Normal ovaries and adnexa. No evidence of PID/abscess. Electronically Signed   By: Lajean Manes M.D.   On: 01/06/2023 16:18    Procedures Procedures    Medications Ordered in ED Medications  0.9 %  sodium chloride infusion ( Intravenous New Bag/Given 01/06/23 1835)  enoxaparin (LOVENOX) injection 40 mg (has no administration in time range)  cefTRIAXone (ROCEPHIN) 1 g in sodium chloride 0.9 % 100 mL IVPB (has no administration in time range)  doxycycline (VIBRA-TABS) tablet 100 mg (has no administration in time range)  ondansetron (ZOFRAN) tablet 4 mg (has no administration in time  range)    Or  ondansetron (ZOFRAN) injection 4 mg (has no administration in time range)  senna-docusate (Senokot-S) tablet 1 tablet (has no administration in time range)  simethicone (MYLICON) chewable tablet 80 mg (has no administration in time range)  metroNIDAZOLE (FLAGYL) tablet 500 mg (has no administration in time range)  sodium chloride 0.9 % bolus 1,000 mL (0 mLs Intravenous Stopped 01/06/23 1827)  ondansetron (ZOFRAN) injection 4 mg (4 mg Intravenous Given 01/06/23 1352)  morphine (PF) 4 MG/ML injection 4 mg (4 mg Intravenous Given 01/06/23 1352)  sodium chloride 0.9 % bolus 1,000 mL (0 mLs Intravenous Stopped 01/06/23 1651)  morphine (PF) 4 MG/ML injection 4 mg (4 mg Intravenous Given 01/06/23 1602)  cefTRIAXone (ROCEPHIN) 1 g in sodium chloride 0.9 % 100 mL IVPB (0 g Intravenous Stopped 01/06/23 1651)  doxycycline (VIBRA-TABS) tablet 100 mg (100 mg Oral Given 01/06/23 1558)  acetaminophen (TYLENOL) tablet 650 mg (650 mg Oral Given 01/06/23 1558)  metroNIDAZOLE (FLAGYL) IVPB 500 mg (0 mg Intravenous Stopped 01/06/23 1827)  iohexol (OMNIPAQUE) 300 MG/ML solution 100 mL (100 mLs Intravenous Contrast Given 01/06/23 1648)  0.9 %  sodium chloride infusion (0 mLs Intravenous Stopped 01/06/23 1834)    ED Course/ Medical Decision Making/ A&P                             Medical Decision Making Amount and/or Complexity of Data Reviewed Labs: ordered. Radiology: ordered.  Risk OTC drugs. Prescription drug management.   This patient presents to the ED for concern of fever, vomiting, pelvic pain, dc, this involves an extensive number of treatment options, and is a complaint that carries with it a high risk of complications and morbidity.  The differential diagnosis includes but not limited to PID, TOA, ovarian cyst, diverticulitis, appendicitis, [id number]    Co morbidities that complicate the patient evaluation  PID, chlamydia    Additional history obtained:  External records from  outside source obtained and reviewed including prior labs on file for comparison    Lab Tests:  I Ordered, and personally interpreted labs.  The pertinent results include:  CBC with elevated WBC at 25. CMP with mildly elevated bili to 1.7, normal LFTs. Hcg negative. UA contaminated, does not appear consistent with UTI. Lipase WNL. Wet prep with clue cells, WBC >10. Lactic 1.1.    Imaging Studies ordered:  I ordered imaging studies including pelvic US, CT a/p with contrast  I independently visualized and interpreted imaging which showed negative for TOA, no acute abnormality  I agree with the radiologist interpretation   Consultations Obtained:  I requested  consultation with the ER attendings Dr. Gretchen Short and Dr. Darl Householder,  and discussed lab and imaging findings as well as pertinent plan - they recommend: antibiotics for PID, CT to evaluate for intra abdominal infection not identified on pelvic exam. Admit to hospitalist for PID. Discussed with Dr. Posey Pronto with [facility], requests admission to GYN. Discussed with Dr. Ilda Basset with GYN, accepts in transfer for admission to [facility], requests lovenox 26m QD, agrees with abx provided, IVF 125/hr.    Problem List / ED Course / Critical interventions / Medication management  22year old female with LLQ pain with fever and vomiting, waking from sleep with symptoms at 2am today also concern for abnormal vaginal dc with concern for STI. Generalized abdominal discomfort with guarding, pain worse in LLQ. Pelvic exam completed on bed pan as patient is in too much pain to move down to stirrups despite Morphine. Found to have grey/blood tinged watery discharge, significant pelvic tenderness. UK[place]ative for TOA, suspect PID. CT a/p completed to rule out other source for pain/infection, CT unremarkable. Provided with IV abx. [facility].  I ordered medication including IVF, rocephin, doxy, flagyl   for PID, morphine, Zofran for pain  Reevaluation of the patient after these medicines showed that the patient improved I have reviewed the patients home medicines and have made adjustments as needed   Social Determinants of Health:  No assigned GYN care   Test / Admission - Considered:  Admit for further management          Final Clinical Impression(s) / ED Diagnoses Final diagnoses:  PID (acute pelvic inflammatory disease)    Rx / DC Orders ED Discharge Orders     None         MTacy Learn PA-C 01/06/23 1Fidela Salisbury MD 01/06/23 2354

## 2023-01-07 LAB — CBC WITH DIFFERENTIAL/PLATELET
Abs Immature Granulocytes: 0.16 10*3/uL — ABNORMAL HIGH (ref 0.00–0.07)
Basophils Absolute: 0.1 10*3/uL (ref 0.0–0.1)
Basophils Relative: 0 %
Eosinophils Absolute: 0 10*3/uL (ref 0.0–0.5)
Eosinophils Relative: 0 %
HCT: 30.9 % — ABNORMAL LOW (ref 36.0–46.0)
Hemoglobin: 10.4 g/dL — ABNORMAL LOW (ref 12.0–15.0)
Immature Granulocytes: 1 %
Lymphocytes Relative: 10 %
Lymphs Abs: 2.3 10*3/uL (ref 0.7–4.0)
MCH: 32.6 pg (ref 26.0–34.0)
MCHC: 33.7 g/dL (ref 30.0–36.0)
MCV: 96.9 fL (ref 80.0–100.0)
Monocytes Absolute: 0.6 10*3/uL (ref 0.1–1.0)
Monocytes Relative: 3 %
Neutro Abs: 20.4 10*3/uL — ABNORMAL HIGH (ref 1.7–7.7)
Neutrophils Relative %: 86 %
Platelets: 196 10*3/uL (ref 150–400)
RBC: 3.19 MIL/uL — ABNORMAL LOW (ref 3.87–5.11)
RDW: 13 % (ref 11.5–15.5)
WBC: 23.5 10*3/uL — ABNORMAL HIGH (ref 4.0–10.5)
nRBC: 0 % (ref 0.0–0.2)

## 2023-01-07 LAB — COMPREHENSIVE METABOLIC PANEL
ALT: 13 U/L (ref 0–44)
AST: 17 U/L (ref 15–41)
Albumin: 2.8 g/dL — ABNORMAL LOW (ref 3.5–5.0)
Alkaline Phosphatase: 59 U/L (ref 38–126)
Anion gap: 8 (ref 5–15)
BUN: 5 mg/dL — ABNORMAL LOW (ref 6–20)
CO2: 23 mmol/L (ref 22–32)
Calcium: 8 mg/dL — ABNORMAL LOW (ref 8.9–10.3)
Chloride: 104 mmol/L (ref 98–111)
Creatinine, Ser: 0.78 mg/dL (ref 0.44–1.00)
GFR, Estimated: >60 mL/min (ref >60)
Glucose, Bld: 94 mg/dL (ref 70–99)
Potassium: 3.9 mmol/L (ref 3.5–5.1)
Sodium: 135 mmol/L (ref 135–145)
Total Bilirubin: 1.1 mg/dL (ref 0.3–1.2)
Total Protein: 5.6 g/dL — ABNORMAL LOW (ref 6.5–8.1)

## 2023-01-07 LAB — PROTIME-INR
INR: 1.5 — ABNORMAL HIGH (ref 0.8–1.2)
Prothrombin Time: 17.8 seconds — ABNORMAL HIGH (ref 11.4–15.2)

## 2023-01-07 LAB — RPR: RPR Ser Ql: NONREACTIVE

## 2023-01-07 LAB — GC/CHLAMYDIA PROBE AMP (~~LOC~~) NOT AT ARMC
Chlamydia: NEGATIVE
Comment: NEGATIVE
Comment: NORMAL
Neisseria Gonorrhea: POSITIVE — AB

## 2023-01-07 LAB — APTT: aPTT: 46 seconds — ABNORMAL HIGH (ref 24–36)

## 2023-01-07 LAB — LACTIC ACID, PLASMA: Lactic Acid, Venous: 1.3 mmol/L (ref 0.5–1.9)

## 2023-01-07 NOTE — Progress Notes (Signed)
   01/06/23 2052  Assess: MEWS Score  Temp 99.8 F (37.7 C)  BP (!) 95/41  MAP (mmHg) (!) 57  Pulse Rate (!) 112  Resp 18  SpO2 97 %  O2 Device Room Air  Assess: MEWS Score  MEWS Temp 0  MEWS Systolic 1  MEWS Pulse 2  MEWS RR 0  MEWS LOC 0  MEWS Score 3  MEWS Score Color Yellow  Assess: if the MEWS score is Yellow or Red  Were vital signs taken at a resting state? Yes  Focused Assessment Change from prior assessment (see assessment flowsheet)  Does the patient meet 2 or more of the SIRS criteria? No  MEWS guidelines implemented  Yes, yellow  Treat  MEWS Interventions Considered administering scheduled or prn medications/treatments as ordered  Take Vital Signs  Increase Vital Sign Frequency  Yellow: Q2hr x1, continue Q4hrs until patient remains green for 12hrs  Escalate  MEWS: Escalate Yellow: Discuss with charge nurse and consider notifying provider and/or RRT  Notify: Charge Nurse/RN  Name of Charge Nurse/RN Notified [facility] RN  Provider Notification  Provider Name/Title Emeterio Reeve MD  Date Provider Notified 01/06/23  Time Provider Notified 2100  Method of Notification Face-to-face  Notification Reason Other (Comment) (YELLOW MEWS)  Provider response See new orders  Date of Provider Response 01/06/23  Time of Provider Response 2100  Assess: SIRS CRITERIA  SIRS Temperature  0  SIRS Pulse 1  SIRS Respirations  0  SIRS WBC 0  SIRS Score Sum  1

## 2023-01-07 NOTE — Progress Notes (Signed)
Vitals taken per pt request    01/07/23 1418  Vitals  Temp 98.9 F (37.2 C)  Temp Source Oral  BP 105/68  MAP (mmHg) 79  BP Location Left Arm  BP Method Automatic  Patient Position (if appropriate) Lying  Pulse Rate 93  Pulse Rate Source Monitor  Resp 16  MEWS COLOR  MEWS Score Color Green  Oxygen Therapy  SpO2 96 %  O2 Device Room Air  MEWS Score  MEWS Temp 0  MEWS Systolic 0  MEWS Pulse 0  MEWS RR 0  MEWS LOC 0  MEWS Score 0

## 2023-01-07 NOTE — Progress Notes (Signed)
Diludid PRN pain med given to pt as requested.

## 2023-01-07 NOTE — Sepsis Progress Note (Signed)
[facility] following for sepsis protocol. 

## 2023-01-07 NOTE — Progress Notes (Signed)
Breakthrough dilaudid IV PRN pain med given to patient as requested

## 2023-01-07 NOTE — Progress Notes (Signed)
Subjective: Patient reports tolerating PO.  Appetite returnng, no emesis Low abdominal pain relieved with pain medication Objective: I have reviewed patient's vital signs, intake and output, medications, and labs. Blood pressure 109/76, pulse (!) 110, temperature 99.2 F (37.3 C), temperature source Oral, resp. rate 20, height 5' 1"$  (1.549 m), weight 75.8 kg, last menstrual period 01/06/2023, SpO2 94 %, not currently breastfeeding.  General: alert, cooperative, and no distress Resp: effort normal GI: soft no guarding minimal tenderness Extremities: extremities normal, atraumatic, no cyanosis or edema CBC    Component Value Date/Time   WBC 23.5 (H) 01/07/2023 0112   RBC 3.19 (L) 01/07/2023 0112   HGB 10.4 (L) 01/07/2023 0112   HGB 12.2 08/23/2021 0912   HCT 30.9 (L) 01/07/2023 0112   HCT 36.2 08/23/2021 0912   PLT 196 01/07/2023 0112   PLT 200 08/23/2021 0912   MCV 96.9 01/07/2023 0112   MCV 92 08/23/2021 0912   MCH 32.6 01/07/2023 0112   MCHC 33.7 01/07/2023 0112   RDW 13.0 01/07/2023 0112   RDW 12.7 08/23/2021 0912   LYMPHSABS 2.3 01/07/2023 0112   LYMPHSABS 1.9 05/02/2021 1437   MONOABS 0.6 01/07/2023 0112   EOSABS 0.0 01/07/2023 0112   EOSABS 0.1 05/02/2021 1437   BASOSABS 0.1 01/07/2023 0112   BASOSABS 0.0 05/02/2021 1437      Latest Ref Rng & Units 01/07/2023    1:11 AM 01/06/2023   12:13 PM 06/23/2022    5:02 PM  CMP  Glucose 70 - 99 mg/dL 94  129  116   BUN 6 - 20 mg/dL 5  11  10   $ Creatinine 0.44 - 1.00 mg/dL 0.78  0.72  0.75   Sodium 135 - 145 mmol/L 135  130  138   Potassium 3.5 - 5.1 mmol/L 3.9  3.6  3.7   Chloride 98 - 111 mmol/L 104  102  106   CO2 22 - 32 mmol/L 23  21  25   $ Calcium 8.9 - 10.3 mg/dL 8.0  9.1  8.8   Total Protein 6.5 - 8.1 g/dL 5.6  8.2  6.6   Total Bilirubin 0.3 - 1.2 mg/dL 1.1  1.7  0.5   Alkaline Phos 38 - 126 U/L 59  83  78   AST 15 - 41 U/L 17  25  17   $ ALT 0 - 44 U/L 13  19  14       $ Assessment/Plan: PID day  2 antibiotics,  normal lactic acid, does not appear septic  LOS: 1 day    Emeterio Reeve, MD 01/07/2023, 6:56 AM

## 2023-01-07 NOTE — Progress Notes (Signed)
3m Oxycodone PRN pain med given as requested

## 2023-01-08 DIAGNOSIS — N730 Acute parametritis and pelvic cellulitis: Principal | ICD-10-CM

## 2023-01-08 LAB — CBC
HCT: 31.7 % — ABNORMAL LOW (ref 36.0–46.0)
Hemoglobin: 10.4 g/dL — ABNORMAL LOW (ref 12.0–15.0)
MCH: 32.2 pg (ref 26.0–34.0)
MCHC: 32.8 g/dL (ref 30.0–36.0)
MCV: 98.1 fL (ref 80.0–100.0)
Platelets: 202 10*3/uL (ref 150–400)
RBC: 3.23 MIL/uL — ABNORMAL LOW (ref 3.87–5.11)
RDW: 12.8 % (ref 11.5–15.5)
WBC: 12.4 10*3/uL — ABNORMAL HIGH (ref 4.0–10.5)
nRBC: 0 % (ref 0.0–0.2)

## 2023-01-08 MED ORDER — HYDROMORPHONE HCL 1 MG/ML IJ SOLN
1.0000 mg | INTRAMUSCULAR | Status: DC | PRN
  Administered 2023-01-08 – 2023-01-09 (×2): 1 mg via INTRAVENOUS
  Filled 2023-01-08 (×2): qty 1

## 2023-01-08 MED ORDER — ACETAMINOPHEN 325 MG PO TABS
650.0000 mg | ORAL_TABLET | Freq: Four times a day (QID) | ORAL | Status: DC | PRN
  Administered 2023-01-08: 650 mg via ORAL
  Filled 2023-01-08: qty 2

## 2023-01-08 MED ORDER — LACTATED RINGERS IV BOLUS
1000.0000 mL | Freq: Once | INTRAVENOUS | Status: AC
  Administered 2023-01-08: 1000 mL via INTRAVENOUS

## 2023-01-08 MED ORDER — IBUPROFEN 600 MG PO TABS
600.0000 mg | ORAL_TABLET | Freq: Four times a day (QID) | ORAL | Status: DC
  Administered 2023-01-08 – 2023-01-09 (×5): 600 mg via ORAL
  Filled 2023-01-08 (×5): qty 1

## 2023-01-08 MED ORDER — SODIUM CHLORIDE 0.9 % IV SOLN: INTRAVENOUS | Status: AC

## 2023-01-08 MED ORDER — KETOROLAC TROMETHAMINE 30 MG/ML IJ SOLN
30.0000 mg | Freq: Four times a day (QID) | INTRAMUSCULAR | Status: DC | PRN
  Administered 2023-01-08: 30 mg via INTRAVENOUS
  Filled 2023-01-08: qty 1

## 2023-01-08 MED ORDER — HYDROMORPHONE HCL 1 MG/ML IJ SOLN
1.0000 mg | INTRAMUSCULAR | Status: AC | PRN
  Administered 2023-01-08 (×3): 1 mg via INTRAVENOUS
  Filled 2023-01-08 (×3): qty 1

## 2023-01-08 NOTE — Progress Notes (Signed)
Patient ID: Rhonda Sparks, female   DOB: March 07, 2001, 22 y.o.   MRN: [id number] Subjective: Patient reports tolerating PO but admits to a decrease in appetite. She reports persistent lower abdominal pain relieved with medication  Objective: I have reviewed patient's vital signs, intake and output, medications, and labs. Blood pressure 109/76, pulse (!) 110, temperature 99.2 F (37.3 C), temperature source Oral, resp. rate 20, height 5' 1"$  (1.549 m), weight 75.8 kg, last menstrual period 01/06/2023, SpO2 94 %, not currently breastfeeding.  General: alert, cooperative, and no distress Resp: effort normal GI: soft no guarding minimal tenderness Extremities: extremities normal, atraumatic, no cyanosis or edema  CBC    Component Value Date/Time   WBC 12.4 (H) 01/08/2023 1008   RBC 3.23 (L) 01/08/2023 1008   HGB 10.4 (L) 01/08/2023 1008   HGB 12.2 08/23/2021 0912   HCT 31.7 (L) 01/08/2023 1008   HCT 36.2 08/23/2021 0912   PLT 202 01/08/2023 1008   PLT 200 08/23/2021 0912   MCV 98.1 01/08/2023 1008   MCV 92 08/23/2021 0912   MCH 32.2 01/08/2023 1008   MCHC 32.8 01/08/2023 1008   RDW 12.8 01/08/2023 1008   RDW 12.7 08/23/2021 0912   LYMPHSABS 2.3 01/07/2023 0112   LYMPHSABS 1.9 05/02/2021 1437   MONOABS 0.6 01/07/2023 0112   EOSABS 0.0 01/07/2023 0112   EOSABS 0.1 05/02/2021 1437   BASOSABS 0.1 01/07/2023 0112   BASOSABS 0.0 05/02/2021 1437      Latest Ref Rng & Units 01/07/2023    1:11 AM 01/06/2023   12:13 PM 06/23/2022    5:02 PM  CMP  Glucose 70 - 99 mg/dL 94  129  116   BUN 6 - 20 mg/dL 5  11  10   $ Creatinine 0.44 - 1.00 mg/dL 0.78  0.72  0.75   Sodium 135 - 145 mmol/L 135  130  138   Potassium 3.5 - 5.1 mmol/L 3.9  3.6  3.7   Chloride 98 - 111 mmol/L 104  102  106   CO2 22 - 32 mmol/L 23  21  25   $ Calcium 8.9 - 10.3 mg/dL 8.0  9.1  8.8   Total Protein 6.5 - 8.1 g/dL 5.6  8.2  6.6   Total Bilirubin 0.3 - 1.2 mg/dL 1.1  1.7  0.5   Alkaline Phos 38 - 126 U/L 59  83  78    AST 15 - 41 U/L 17  25  17   $ ALT 0 - 44 U/L 13  19  14       $ Assessment/Plan: 22 yo admitted with PID - Patient with Tmax 103 at midnight 01/08/23 - Will continue IV antibiotic - Follow up blood cultures- prelim negative - Discussed positive gonorrhea results and importance to have partner tested and treated - Continue current care   LOS: 2 days    Mora Bellman, MD 01/08/2023, 11:43 AM

## 2023-01-08 NOTE — Progress Notes (Signed)
Pt complained of 10/10 lower abdominal pain and nausea. Pt stated she did vomit once.   Notified MD on call, new orders received.

## 2023-01-08 NOTE — TOC CM/SW Note (Signed)
  Transition of Care (TOC) Screening Note   Patient Details  Name: Rhonda Sparks Date of Birth: 2001/03/11   Transition of Care Department [facility]) has reviewed patient and no TOC needs have been identified at this time. We will continue to monitor patient advancement through interdisciplinary progression rounds. If new patient transition needs arise, please place a TOC consult.

## 2023-01-08 NOTE — Progress Notes (Signed)
MEWS Progress Note  Patient Details Name: Rhonda Sparks MRN: [id number] DOB: 23-Sep-2001 Today's Date: 01/08/2023   MEWS Flowsheet Documentation:  Assess: MEWS Score Temp: (!) 103 F (39.4 C) BP: (!) 98/46 MAP (mmHg): (!) 60 Pulse Rate: (!) 115 Resp: 18 Level of Consciousness: Alert SpO2: 97 % O2 Device: Room Air Patient Activity (if Appropriate): In bed Assess: MEWS Score MEWS Temp: 2 MEWS Systolic: 1 MEWS Pulse: 2 MEWS RR: 0 MEWS LOC: 0 MEWS Score: 5 MEWS Score Color: Red Assess: SIRS CRITERIA SIRS Temperature : 1 SIRS Respirations : 0 SIRS Pulse: 1 SIRS WBC: 0 SIRS Score Sum : 2 Assess: if the MEWS score is Yellow or Red Were vital signs taken at a resting state?: Yes Focused Assessment: No change from prior assessment Does the patient meet 2 or more of the SIRS criteria?: No MEWS guidelines implemented : Yes, red Treat MEWS Interventions: Considered administering scheduled or prn medications/treatments as ordered Take Vital Signs Increase Vital Sign Frequency : Red: Q1hr x2, continue Q4hrs until patient remains green for 12hrs Escalate MEWS: Escalate: Red: Discuss with charge nurse and notify provider. Consider notifying RRT. If remains red for 2 hours consider need for higher level of care Notify: Charge Nurse/RN Name of Charge Nurse/RN Notified: Prescott Parma, RN Provider Notification Provider Name/Title: Dr. Kennon Rounds Date Provider Notified: 01/08/23 Time Provider Notified: [id number] Method of Notification: Call Notification Reason: Other (Comment) (red mews) Provider response: See new orders Date of Provider Response: 01/08/23 Time of Provider Response: [id number]      Suella Grove 01/08/2023, 12:28 AM

## 2023-01-09 ENCOUNTER — Other Ambulatory Visit (HOSPITAL_COMMUNITY): Payer: Self-pay

## 2023-01-09 LAB — CBC
HCT: 29.9 % — ABNORMAL LOW (ref 36.0–46.0)
Hemoglobin: 10.1 g/dL — ABNORMAL LOW (ref 12.0–15.0)
MCH: 32.8 pg (ref 26.0–34.0)
MCHC: 33.8 g/dL (ref 30.0–36.0)
MCV: 97.1 fL (ref 80.0–100.0)
Platelets: 204 10*3/uL (ref 150–400)
RBC: 3.08 MIL/uL — ABNORMAL LOW (ref 3.87–5.11)
RDW: 12.8 % (ref 11.5–15.5)
WBC: 10.5 10*3/uL (ref 4.0–10.5)
nRBC: 0 % (ref 0.0–0.2)

## 2023-01-09 LAB — APTT: aPTT: 36 seconds (ref 24–36)

## 2023-01-09 LAB — PROTIME-INR
INR: 1.2 (ref 0.8–1.2)
Prothrombin Time: 15.3 seconds — ABNORMAL HIGH (ref 11.4–15.2)

## 2023-01-09 MED ORDER — OXYCODONE HCL 5 MG PO TABS
5.0000 mg | ORAL_TABLET | Freq: Four times a day (QID) | ORAL | 0 refills | Status: DC | PRN
  Filled 2023-01-09: qty 15, 2d supply, fill #0

## 2023-01-09 MED ORDER — ONDANSETRON HCL 4 MG PO TABS
4.0000 mg | ORAL_TABLET | Freq: Four times a day (QID) | ORAL | 0 refills | Status: DC | PRN
  Filled 2023-01-09: qty 20, 5d supply, fill #0

## 2023-01-09 MED ORDER — ACETAMINOPHEN 325 MG PO TABS
650.0000 mg | ORAL_TABLET | Freq: Four times a day (QID) | ORAL | 0 refills | Status: AC | PRN
  Filled 2023-01-09: qty 30, 4d supply, fill #0

## 2023-01-09 MED ORDER — IBUPROFEN 600 MG PO TABS
600.0000 mg | ORAL_TABLET | Freq: Four times a day (QID) | ORAL | 0 refills | Status: DC
  Filled 2023-01-09: qty 30, 8d supply, fill #0

## 2023-01-09 MED ORDER — DOXYCYCLINE HYCLATE 100 MG PO TABS
100.0000 mg | ORAL_TABLET | Freq: Two times a day (BID) | ORAL | 0 refills | Status: AC
  Filled 2023-01-09: qty 24, 12d supply, fill #0

## 2023-01-09 MED ORDER — METRONIDAZOLE 500 MG PO TABS
500.0000 mg | ORAL_TABLET | Freq: Two times a day (BID) | ORAL | 0 refills | Status: AC
  Filled 2023-01-09: qty 24, 12d supply, fill #0

## 2023-01-09 MED FILL — Hydromorphone HCl Preservative Free (PF) Inj 1 MG/ML: INTRAMUSCULAR | Qty: 1 | Status: AC

## 2023-01-09 NOTE — Discharge Summary (Signed)
Physician Discharge Summary  Patient ID: Rhonda Sparks MRN: [id number] DOB/AGE: Apr 24, 2001 22 y.o.  Admit date: 01/06/2023 Discharge date: 01/09/2023  Admission Diagnoses: PID  Discharge Diagnoses:  Principal Problem:   PID (acute pelvic inflammatory disease) Active Problems:   Pelvic inflammatory disease   Discharged Condition: good  Hospital Course: Patient admitted with clinical diagnosis of PID. She received IV antibiotic and pain improved over the course of her admission. Patient was noted to be positive for Gonorrhea. She was advised to inform partner in order to have them treated. Clinical status improved and all labs normalized. She remained afebrile for over 36 hours. Patient stable for discharge home to complete oral course of antibiotic for another 12 days. She is scheduled for appointment in the office in 2 weeks. All precautions were reviewed and questions were answered   Consults: None   Treatments: Rocephin, doxycycline and Flagyl  Discharge Exam: Blood pressure (!) 97/50, pulse 71, temperature 97.8 F (36.6 C), temperature source Oral, resp. rate 17, height 5' 1"$  (1.549 m), weight 75.8 kg, last menstrual period 01/06/2023, SpO2 99 %, not currently breastfeeding. GENERAL: Well-developed, well-nourished female in no acute distress.  LUNGS: Clear to auscultation bilaterally.  HEART: Regular rate and rhythm. ABDOMEN: Soft, nontender, nondistended. No organomegaly. PELVIC: Not performed EXTREMITIES: No cyanosis, clubbing, or edema, 2+ distal pulses.   Disposition: Discharge disposition: 01-Home or Self Care     Home   Allergies as of 01/09/2023   No Known Allergies      Medication List     TAKE these medications    acetaminophen 325 MG tablet Commonly known as: TYLENOL Take 2 tablets (650 mg total) by mouth every 6 (six) hours as needed for fever.   doxycycline 100 MG tablet Commonly known as: VIBRA-TABS Take 1 tablet (100 mg total) by mouth  every 12 (twelve) hours for 12 days.   ibuprofen 600 MG tablet Commonly known as: ADVIL Take 1 tablet (600 mg total) by mouth every 6 (six) hours.   metroNIDAZOLE 500 MG tablet Commonly known as: FLAGYL Take 1 tablet (500 mg total) by mouth every 12 (twelve) hours for 12 days.   ondansetron 4 MG tablet Commonly known as: ZOFRAN Take 1 tablet (4 mg total) by mouth every 6 (six) hours as needed for nausea.   oxyCODONE 5 MG immediate release tablet Commonly known as: Oxy IR/ROXICODONE Take 1-2 tablets (5-10 mg total) by mouth every 6 (six) hours as needed for severe pain or moderate pain.        Follow-up [facility] Follow up in 2 week(s).   Specialty: Obstetrics and Gynecology Contact information: [place] [phone]                Signed: Mora Bellman 01/09/2023, 3:15 PM

## 2023-01-09 NOTE — Progress Notes (Signed)
RN gave patient DC instructions and the patient stated understanding iv has been removed and she is waiting for her ride. TOC medications have been dropped off.

## 2023-01-11 LAB — CULTURE, BLOOD (ROUTINE X 2)
Culture: NO GROWTH
Culture: NO GROWTH
Special Requests: ADEQUATE
Special Requests: ADEQUATE

## 2023-12-06 ENCOUNTER — Ambulatory Visit
Admission: EM | Admit: 2023-12-06 | Discharge: 2023-12-06 | Disposition: A | Discharge status: Home / Self Care | Payer: Medicaid Other | Attending: Family Medicine | Admitting: Family Medicine

## 2023-12-06 DIAGNOSIS — Z113 Encounter for screening for infections with a predominantly sexual mode of transmission: Secondary | ICD-10-CM | POA: Insufficient documentation

## 2023-12-06 DIAGNOSIS — B9689 Other specified bacterial agents as the cause of diseases classified elsewhere: Secondary | ICD-10-CM | POA: Diagnosis present

## 2023-12-06 DIAGNOSIS — N760 Acute vaginitis: Secondary | ICD-10-CM | POA: Insufficient documentation

## 2023-12-06 DIAGNOSIS — R350 Frequency of micturition: Secondary | ICD-10-CM | POA: Diagnosis present

## 2023-12-06 DIAGNOSIS — L732 Hidradenitis suppurativa: Secondary | ICD-10-CM | POA: Insufficient documentation

## 2023-12-06 LAB — POCT URINALYSIS DIP (MANUAL ENTRY)
Bilirubin, UA: NEGATIVE
Blood, UA: NEGATIVE
Glucose, UA: NEGATIVE mg/dL
Ketones, POC UA: NEGATIVE mg/dL
Nitrite, UA: NEGATIVE
Protein Ur, POC: NEGATIVE mg/dL
Spec Grav, UA: >=1.03 — AB (ref 1.010–1.025)
Urobilinogen, UA: 0.2 U/dL
pH, UA: 6.5 (ref 5.0–8.0)

## 2023-12-06 LAB — POCT URINE PREGNANCY: Preg Test, Ur: NEGATIVE

## 2023-12-06 MED ORDER — METRONIDAZOLE 500 MG PO TABS: 500.0000 mg | ORAL_TABLET | Freq: Two times a day (BID) | ORAL | 0 refills | Status: DC

## 2023-12-06 MED ORDER — FLUCONAZOLE 150 MG PO TABS: 150.0000 mg | ORAL_TABLET | ORAL | 0 refills | Status: DC
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
## 2023-12-06 NOTE — ED Triage Notes (Signed)
Pt c/o vaginal d/c itching x 3-4 days "I get BV a lot and UTIs and it's either one or the other"-NAD-steady gait

## 2023-12-06 NOTE — Discharge Instructions (Signed)
Please start metronidazole for suspected BV infection and fluconazole for suspected yeast infection. Make sure you hydrate very well with plain water and a quantity of 80 ounces of water a day.  Please limit drinks that are considered urinary irritants such as soda, sweet tea, coffee, energy drinks, alcohol.  These can worsen your urinary and genital symptoms but also be the source of them.  I will let you know about your vaginal swab and urine culture results through MyChart to see if we need to prescribe or change your antibiotics based off of those results.  I have a suspicion that you have hidradenitis of the groin area.  Would like to emphasize that you get a consultation with a dermatologist, I have placed a referral for you to be seen.

## 2023-12-07 LAB — HIV ANTIBODY (ROUTINE TESTING W REFLEX): HIV Screen 4th Generation wRfx: NONREACTIVE

## 2023-12-07 LAB — RPR: RPR Ser Ql: NONREACTIVE

## 2023-12-08 LAB — URINE CULTURE: Culture: <10000 — AB

## 2023-12-09 LAB — CERVICOVAGINAL ANCILLARY ONLY
Bacterial Vaginitis (gardnerella): POSITIVE — AB
Candida Glabrata: NEGATIVE
Candida Vaginitis: NEGATIVE
Chlamydia: NEGATIVE
Comment: NEGATIVE
Comment: NEGATIVE
Comment: NEGATIVE
Comment: NEGATIVE
Comment: NEGATIVE
Comment: NORMAL
Neisseria Gonorrhea: NEGATIVE
Trichomonas: NEGATIVE

## 2024-06-17 ENCOUNTER — Emergency Department (HOSPITAL_BASED_OUTPATIENT_CLINIC_OR_DEPARTMENT_OTHER): Admission: EM | Admit: 2024-06-17 | Discharge: 2024-06-17 | Discharge status: Against Medical Advice

## 2024-06-17 ENCOUNTER — Other Ambulatory Visit: Payer: Self-pay

## 2024-06-17 DIAGNOSIS — M545 Low back pain, unspecified: Secondary | ICD-10-CM | POA: Diagnosis present

## 2024-06-17 DIAGNOSIS — Z5321 Procedure and treatment not carried out due to patient leaving prior to being seen by health care provider: Secondary | ICD-10-CM | POA: Diagnosis not present

## 2024-06-17 DIAGNOSIS — N920 Excessive and frequent menstruation with regular cycle: Secondary | ICD-10-CM | POA: Diagnosis not present

## 2024-06-17 DIAGNOSIS — R5383 Other fatigue: Secondary | ICD-10-CM | POA: Diagnosis not present

## 2024-06-17 DIAGNOSIS — L299 Pruritus, unspecified: Secondary | ICD-10-CM | POA: Diagnosis not present

## 2024-06-17 LAB — URINALYSIS, ROUTINE W REFLEX MICROSCOPIC
Bacteria, UA: NONE SEEN
Bilirubin Urine: NEGATIVE
Glucose, UA: NEGATIVE mg/dL
Ketones, ur: NEGATIVE mg/dL
Nitrite: NEGATIVE
Protein, ur: NEGATIVE mg/dL
Specific Gravity, Urine: 1.024 (ref 1.005–1.030)
pH: 6 (ref 5.0–8.0)

## 2024-06-17 LAB — CBC
HCT: 40.6 % (ref 36.0–46.0)
Hemoglobin: 13.7 g/dL (ref 12.0–15.0)
MCH: 32.2 pg (ref 26.0–34.0)
MCHC: 33.7 g/dL (ref 30.0–36.0)
MCV: 95.3 fL (ref 80.0–100.0)
Platelets: 270 K/uL (ref 150–400)
RBC: 4.26 MIL/uL (ref 3.87–5.11)
RDW: 12.7 % (ref 11.5–15.5)
WBC: 10.5 K/uL (ref 4.0–10.5)
nRBC: 0 % (ref 0.0–0.2)

## 2024-06-17 LAB — PREGNANCY, URINE: Preg Test, Ur: NEGATIVE

## 2024-06-17 NOTE — ED Triage Notes (Addendum)
 Fatigued, lower back pain. Urine dark, itching. Heavy menstruation currently.

## 2024-06-17 NOTE — ED Notes (Signed)
 Pt advised Registration she was leaving

## 2024-06-18 ENCOUNTER — Emergency Department (HOSPITAL_BASED_OUTPATIENT_CLINIC_OR_DEPARTMENT_OTHER)
Admission: EM | Admit: 2024-06-18 | Discharge: 2024-06-19 | Disposition: A | Discharge status: Home / Self Care | Attending: Emergency Medicine | Admitting: Emergency Medicine

## 2024-06-18 ENCOUNTER — Encounter (HOSPITAL_BASED_OUTPATIENT_CLINIC_OR_DEPARTMENT_OTHER): Payer: Self-pay

## 2024-06-18 ENCOUNTER — Other Ambulatory Visit: Payer: Self-pay

## 2024-06-18 DIAGNOSIS — N3000 Acute cystitis without hematuria: Secondary | ICD-10-CM

## 2024-06-18 DIAGNOSIS — B9689 Other specified bacterial agents as the cause of diseases classified elsewhere: Secondary | ICD-10-CM | POA: Diagnosis not present

## 2024-06-18 DIAGNOSIS — N390 Urinary tract infection, site not specified: Secondary | ICD-10-CM | POA: Diagnosis not present

## 2024-06-18 DIAGNOSIS — R300 Dysuria: Secondary | ICD-10-CM | POA: Diagnosis present

## 2024-06-18 LAB — CBC WITH DIFFERENTIAL/PLATELET
Abs Immature Granulocytes: 0.03 K/uL (ref 0.00–0.07)
Basophils Absolute: 0 K/uL (ref 0.0–0.1)
Basophils Relative: 0 %
Eosinophils Absolute: 0.1 K/uL (ref 0.0–0.5)
Eosinophils Relative: 1 %
HCT: 40.6 % (ref 36.0–46.0)
Hemoglobin: 13.5 g/dL (ref 12.0–15.0)
Immature Granulocytes: 0 %
Lymphocytes Relative: 26 %
Lymphs Abs: 2.5 K/uL (ref 0.7–4.0)
MCH: 31.6 pg (ref 26.0–34.0)
MCHC: 33.3 g/dL (ref 30.0–36.0)
MCV: 95.1 fL (ref 80.0–100.0)
Monocytes Absolute: 0.5 K/uL (ref 0.1–1.0)
Monocytes Relative: 5 %
Neutro Abs: 6.2 K/uL (ref 1.7–7.7)
Neutrophils Relative %: 68 %
Platelets: 275 K/uL (ref 150–400)
RBC: 4.27 MIL/uL (ref 3.87–5.11)
RDW: 12.7 % (ref 11.5–15.5)
WBC: 9.3 K/uL (ref 4.0–10.5)
nRBC: 0 % (ref 0.0–0.2)

## 2024-06-18 LAB — PREGNANCY, URINE: Preg Test, Ur: NEGATIVE

## 2024-06-18 LAB — URINALYSIS, ROUTINE W REFLEX MICROSCOPIC
Bacteria, UA: NONE SEEN
Bilirubin Urine: NEGATIVE
Glucose, UA: NEGATIVE mg/dL
Ketones, ur: NEGATIVE mg/dL
Nitrite: NEGATIVE
Protein, ur: NEGATIVE mg/dL
Specific Gravity, Urine: 1.024 (ref 1.005–1.030)
pH: 7.5 (ref 5.0–8.0)

## 2024-06-18 LAB — COMPREHENSIVE METABOLIC PANEL WITH GFR
ALT: 16 U/L (ref 0–44)
AST: 23 U/L (ref 15–41)
Albumin: 4.4 g/dL (ref 3.5–5.0)
Alkaline Phosphatase: 92 U/L (ref 38–126)
Anion gap: 6 (ref 5–15)
BUN: 12 mg/dL (ref 6–20)
CO2: 29 mmol/L (ref 22–32)
Calcium: 9.8 mg/dL (ref 8.9–10.3)
Chloride: 104 mmol/L (ref 98–111)
Creatinine, Ser: 0.59 mg/dL (ref 0.44–1.00)
GFR, Estimated: >60 mL/min (ref >60)
Glucose, Bld: 118 mg/dL — ABNORMAL HIGH (ref 70–99)
Potassium: 4.1 mmol/L (ref 3.5–5.1)
Sodium: 139 mmol/L (ref 135–145)
Total Bilirubin: <0.2 mg/dL (ref 0.0–1.2)
Total Protein: 7.4 g/dL (ref 6.5–8.1)

## 2024-06-18 NOTE — ED Triage Notes (Signed)
 Pt report she was here yesterday for possible UTI but had to leave before being seen. Pt reports she is here today for same s/s. Lower back pain, urine dark,itching. Pt reports today she feels worse.

## 2024-06-19 MED ORDER — CEPHALEXIN 500 MG PO CAPS: 500.0000 mg | ORAL_CAPSULE | Freq: Three times a day (TID) | ORAL | 0 refills | Status: DC

## 2024-06-19 MED ORDER — CEPHALEXIN 250 MG PO CAPS
500.0000 mg | ORAL_CAPSULE | Freq: Once | ORAL | Status: AC
  Administered 2024-06-19: 500 mg via ORAL
  Filled 2024-06-19: qty 2

## 2024-06-19 NOTE — Discharge Instructions (Signed)
 Begin taking Keflex  as prescribed.  Drink plenty of fluids.  Follow-up with primary doctor if not improving in the next few days, and return to the ER if you develop worsening pain, high fevers, or for other new and concerning symptoms.

## 2024-06-19 NOTE — ED Provider Notes (Signed)
 [place] EMERGENCY DEPARTMENT AT [facility] Provider Note   CSN: [id number] Arrival date & time: 06/18/24  2026     Patient presents with: Urinary Frequency   NYILAH KIGHT is a 23 y.o. female.   Patient is a 23 year old female presenting with a several day history of dysuria and frequency.  She was seen initially yesterday, but did not stay due to prolonged wait times.  She reports continued burning with urination.  No blood in the urine.  No bowel or bladder complaints.  She has had UTIs in the past and this feels similar.       Prior to Admission medications   Medication Sig Start Date End Date Taking? Authorizing Provider  acetaminophen  (TYLENOL ) 325 MG tablet Take 2 tablets (650 mg total) by mouth every 6 (six) hours as needed for fever. 01/09/23   Constant, Peggy, MD  fluconazole  (DIFLUCAN ) 150 MG tablet Take 1 tablet (150 mg total) by mouth once a week. 12/06/23   Christopher Savannah, PA-C  ibuprofen  (ADVIL ) 600 MG tablet Take 1 tablet (600 mg total) by mouth every 6 (six) hours. 01/09/23   Constant, Peggy, MD  metroNIDAZOLE  (FLAGYL ) 500 MG tablet Take 1 tablet (500 mg total) by mouth 2 (two) times daily with a meal. DO NOT CONSUME ALCOHOL WHILE TAKING THIS MEDICATION. 12/06/23   Christopher Savannah, PA-C  ondansetron  (ZOFRAN ) 4 MG tablet Take 1 tablet (4 mg total) by mouth every 6 (six) hours as needed for nausea. 01/09/23   Constant, Peggy, MD  oxyCODONE  (OXY IR/ROXICODONE ) 5 MG immediate release tablet Take 1-2 tablets (5-10 mg total) by mouth every 6 (six) hours as needed for severe pain or moderate pain. 01/09/23   Constant, Peggy, MD    Allergies: Patient has no known allergies.    Review of Systems  All other systems reviewed and are negative.   Updated Vital Signs BP 124/72 (BP Location: Right Arm)   Pulse 90   Temp 98 F (36.7 C) (Oral)   Resp 18   LMP 06/16/2024 (Exact Date)   SpO2 97%   Physical Exam Vitals and nursing note reviewed.  Constitutional:       General: She is not in acute distress.    Appearance: She is well-developed. She is not diaphoretic.  HENT:     Head: Normocephalic and atraumatic.  Cardiovascular:     Rate and Rhythm: Normal rate and regular rhythm.     Heart sounds: No murmur heard.    No friction rub. No gallop.  Pulmonary:     Effort: Pulmonary effort is normal. No respiratory distress.     Breath sounds: Normal breath sounds. No wheezing.  Abdominal:     General: Bowel sounds are normal. There is no distension.     Palpations: Abdomen is soft.     Tenderness: There is no abdominal tenderness.  Musculoskeletal:        General: Normal range of motion.     Cervical back: Normal range of motion and neck supple.  Skin:    General: Skin is warm and dry.  Neurological:     General: No focal deficit present.     Mental Status: She is alert and oriented to person, place, and time.     (all labs ordered are listed, but only abnormal results are displayed) Labs Reviewed  URINALYSIS, ROUTINE W REFLEX MICROSCOPIC - Abnormal; Notable for the following components:      Result Value   APPearance HAZY (*)  Hgb urine dipstick SMALL (*)    Leukocytes,Ua SMALL (*)    Crystals PRESENT (*)    All other components within normal limits  COMPREHENSIVE METABOLIC PANEL WITH GFR - Abnormal; Notable for the following components:   Glucose, Bld 118 (*)    All other components within normal limits  CBC WITH DIFFERENTIAL/PLATELET  PREGNANCY, URINE    EKG: None  Radiology: No results found.   Procedures   Medications Ordered in the ED  cephALEXin (KEFLEX) capsule 500 mg (has no administration in time range)                                    Medical Decision Making Amount and/or Complexity of Data Reviewed Labs: ordered.  Risk Prescription drug management.   Patient presenting with UTI-like symptoms.  Patient arrives with stable vital signs and is afebrile.  Physical examination is unremarkable.  Laboratory  studies obtained including CBC, basic metabolic panel, urinalysis, and urine pregnancy test.  Urinalysis consistent with UTI.  Patient has no leukocytosis and normal renal function.  She will be treated with Keflex and follow-up as needed.     Final diagnoses:  None    ED Discharge Orders     None          Geroldine Berg, MD 06/19/24 [phone]

## 2024-08-24 ENCOUNTER — Encounter: Payer: Self-pay | Admitting: Dermatology

## 2024-08-24 ENCOUNTER — Ambulatory Visit: Admitting: Dermatology

## 2024-08-24 VITALS — BP 111/77

## 2024-08-24 DIAGNOSIS — L732 Hidradenitis suppurativa: Secondary | ICD-10-CM

## 2024-08-24 MED ORDER — SPIRONOLACTONE 100 MG PO TABS: 100.0000 mg | ORAL_TABLET | Freq: Every day | ORAL | 3 refills | Status: AC

## 2024-08-24 NOTE — Progress Notes (Signed)
 New Patient Visit   Subjective  Rhonda Sparks is a 23 y.o. female who presents for the following: Boils - she gets bumps and boils in her groin area, buttocks, abdomen and back. She associates them with her period every month or sometimes with increased stress. During her pregnancies, she got boils under her breasts but that stops when she's not pregnant. She has only had a few that have gotten big and painful. She has never been prescribed any medications. She washes all the areas with Dial antibacterial soap and the rest of her body with Dove. She is not pregnant or trying to conceive right now. She is not on birth control and she is not sexually active at this time.  Spironolactone can cause increased urination and cause blood pressure to decrease. Please watch for signs of lightheadedness and be cautious when changing position. It can sometimes cause breast tenderness or an irregular period in premenopausal women. It can also increase potassium. The increase in potassium usually is not a concern unless you are taking other medicines that also increase potassium, so please be sure your doctor knows all of the other medications you are taking. This medication should not be taken by pregnant women.  This medicine should also not be taken together with sulfa drugs like Bactrim (trimethoprim/sulfamethexazole).    The following portions of the chart were reviewed this encounter and updated as appropriate: medications, allergies, medical history  Review of Systems:  No other skin or systemic complaints except as noted in HPI or Assessment and Plan.  Objective  Well appearing patient in no apparent distress; mood and affect are within normal limits.   A focused examination was performed of the following areas: trunk, groin area   Relevant exam findings are noted in the Assessment and Plan.    Assessment & Plan   HIDRADENITIS SUPPURATIVA Exam: [place] Stage 1: abscess formation (single  or multiple) without sinus tracts or scarring   Assessment: Chronic hidradenitis suppurativa with flares primarily in the groin, buttocks, and occasionally under the breasts. Symptoms are associated with menstrual cycles and have been present since age 83. Currently in early stage with potential for progression to more severe stages with scarring and chronic drainage. Flares are influenced by hormonal changes, diet, and potentially weight changes. Current management includes antibacterial soap use, which has been beneficial in reducing flare severity.  Plan: - Prescribe spironolactone 100 mg daily with dinner to block hormone receptors and reduce flare frequency and intensity. - Advise use of mupirocin ointment during flares to promote healing and provide antibiotic coverage. - Recommend continued use of antibacterial soap, such as Dial, and consider trying CeraVe benzoyl peroxide wash for additional bacterial control. - Discuss potential side effects of spironolactone, including lightheadedness, dizziness, and racing heart; advise to stop medication and contact the office if these occur. - Advise on the importance of birth control if sexually active while on spironolactone due to potential risks during pregnancy. - Discuss lifestyle modifications including dietary changes to reduce intake of dairy, sugar, and processed foods, and encourage weight management. - Advise against smoking and marijuana use as they can trigger flares. - Plan to address scarring once flares are under control.    Return for Hidradenitis follow up - 4-5 months depending on availability.  I, Roseline Hutchinson, CMA, am acting as scribe for [employer], DO .   Documentation: I have reviewed the above documentation for accuracy and completeness, and I agree with the above.  Delon Lenis,  DO

## 2024-08-24 NOTE — Patient Instructions (Addendum)
 VISIT SUMMARY:  Today, we discussed your chronic boils in the groin area, which are suspected to be hidradenitis suppurativa. This condition has been affecting you since you were 23 years old and tends to flare up with your menstrual cycle. We reviewed your current management strategies and discussed a new treatment plan to help reduce the frequency and intensity of your flares.  YOUR PLAN:  -HIDRADENITIS SUPPURATIVA:  Hidradenitis suppurativa is a chronic skin condition that causes painful boils and abscesses, often in areas where skin rubs together. It can be influenced by hormonal changes, diet, and weight.  + We will start you on spironolactone 100 mg daily with dinner to help block hormone receptors and reduce flare frequency and intensity.  +During flares, use mupirocin ointment to promote healing and provide antibiotic coverage.  +Continue using antibacterial soap like Dial, and consider trying CeraVe benzoyl peroxide wash for additional bacterial control.  +Be aware of potential side effects of spironolactone, such as lightheadedness, dizziness, and a racing heart; stop the medication and contact our office if these occur.  +If you become sexually active, use birth control while on spironolactone due to potential risks during pregnancy. Additionally, make lifestyle changes such as reducing dairy, sugar, and processed foods in your diet, managing your weight, and avoiding smoking and marijuana use. We will address scarring once your flares are under control.  INSTRUCTIONS:  Please start taking spironolactone 100 mg daily with dinner and use mupirocin ointment during flares. Continue using antibacterial soap and consider trying CeraVe benzoyl peroxide wash. If you experience any side effects from spironolactone, stop taking it and contact our office immediately. If you become sexually active, ensure you use birth control while on spironolactone.  Make dietary changes to reduce dairy, sugar,  and processed foods, manage your weight, and avoid smoking and marijuana use. We will follow up to address scarring once your flares are under control.    Important Information  Due to recent changes in healthcare laws, you may see results of your pathology and/or laboratory studies on MyChart before the doctors have had a chance to review them. We understand that in some cases there may be results that are confusing or concerning to you. Please understand that not all results are received at the same time and often the doctors may need to interpret multiple results in order to provide you with the best plan of care or course of treatment. Therefore, we ask that you please give us  2 business days to thoroughly review all your results before contacting the office for clarification. Should we see a critical lab result, you will be contacted sooner.   If You Need Anything After Your Visit  If you have any questions or concerns for your doctor, please call our main line at [phone] If no one answers, please leave a voicemail as directed and we will return your call as soon as possible. Messages left after 4 pm will be answered the following business day.   You may also send us  a message via MyChart. We typically respond to MyChart messages within 1-2 business days.  For prescription refills, please ask your pharmacy to contact our office. Our fax number is [phone].  If you have an urgent issue when the clinic is closed that cannot wait until the next business day, you can page your doctor at the number below.    Please note that while we do our best to be available for urgent issues outside of office hours, we are not  available 24/7.   If you have an urgent issue and are unable to reach us , you may choose to seek medical care at your doctor's office, retail clinic, urgent care center, or emergency room.  If you have a medical emergency, please immediately call 911 or go to the emergency  department. In the event of inclement weather, please call our main line at [phone] for an update on the status of any delays or closures.  Dermatology Medication Tips: Please keep the boxes that topical medications come in in order to help keep track of the instructions about where and how to use these. Pharmacies typically print the medication instructions only on the boxes and not directly on the medication tubes.   If your medication is too expensive, please contact our office at [phone] or send us  a message through MyChart.   We are unable to tell what your co-pay for medications will be in advance as this is different depending on your insurance coverage. However, we may be able to find a substitute medication at lower cost or fill out paperwork to get insurance to cover a needed medication.   If a prior authorization is required to get your medication covered by your insurance company, please allow us  1-2 business days to complete this process.  Drug prices often vary depending on where the prescription is filled and some pharmacies may offer cheaper prices.  The website www.goodrx.com contains coupons for medications through different pharmacies. The prices here do not account for what the cost may be with help from insurance (it may be cheaper with your insurance), but the website can give you the price if you did not use any insurance.  - You can print the associated coupon and take it with your prescription to the pharmacy.  - You may also stop by our office during regular business hours and pick up a GoodRx coupon card.  - If you need your prescription sent electronically to a different pharmacy, notify our office through [facility] or by phone at [phone]

## 2024-11-13 ENCOUNTER — Encounter (HOSPITAL_COMMUNITY): Payer: Self-pay

## 2024-11-13 ENCOUNTER — Ambulatory Visit (HOSPITAL_COMMUNITY)
Admission: EM | Admit: 2024-11-13 | Discharge: 2024-11-13 | Disposition: A | Discharge status: Home / Self Care | Attending: Internal Medicine | Admitting: Internal Medicine

## 2024-11-13 DIAGNOSIS — F17210 Nicotine dependence, cigarettes, uncomplicated: Secondary | ICD-10-CM

## 2024-11-13 DIAGNOSIS — J111 Influenza due to unidentified influenza virus with other respiratory manifestations: Secondary | ICD-10-CM | POA: Diagnosis not present

## 2024-11-13 LAB — POC COVID19/FLU A&B COMBO
Covid Antigen, POC: NEGATIVE
Influenza A Antigen, POC: NEGATIVE
Influenza B Antigen, POC: NEGATIVE

## 2024-11-13 MED ORDER — OSELTAMIVIR PHOSPHATE 75 MG PO CAPS: 75.0000 mg | ORAL_CAPSULE | Freq: Two times a day (BID) | ORAL | 0 refills | Status: DC

## 2024-11-13 MED ORDER — ALBUTEROL SULFATE HFA 108 (90 BASE) MCG/ACT IN AERS: 1.0000 | INHALATION_SPRAY | Freq: Four times a day (QID) | RESPIRATORY_TRACT | 0 refills | Status: AC | PRN

## 2024-11-13 MED ORDER — IBUPROFEN 800 MG PO TABS
800.0000 mg | ORAL_TABLET | Freq: Once | ORAL | Status: AC
  Administered 2024-11-13: 800 mg via ORAL

## 2024-11-13 MED ORDER — IBUPROFEN 800 MG PO TABS
ORAL_TABLET | ORAL | Status: AC
  Filled 2024-11-13: qty 1

## 2024-11-13 MED ORDER — ONDANSETRON 4 MG PO TBDP: 4.0000 mg | ORAL_TABLET | Freq: Three times a day (TID) | ORAL | 0 refills | Status: DC | PRN

## 2024-11-13 NOTE — ED Triage Notes (Signed)
 Presenting with cough, body aches, sore throat, light headed, ear fullness, SOB, Chest pain, and headaches onset this past Tuesday. States last night the symptoms got worse and stopped responding to meds. States her sons were sick first. Her sons were not tested.   Patient taking Mucinex, Dayquil, and cough drops.

## 2024-11-13 NOTE — Discharge Instructions (Signed)
 You have the flu. Take Tamiflu  every 12 hours for the next 5 days to improve symptoms and stop the virus from replicating in your body. Ibuprofen /tylenol  as needed for fevers and body aches. Mucinex as needed for nasal congestion. Zofran  as needed for nausea/vomiting.  Albuterol  inhaler 2 puff every 4-6 hours as needed for shortness of breath/wheezing.   If you develop any new or worsening symptoms or if your symptoms do not start to improve, please return here or follow-up with your primary care provider. If your symptoms are severe, please go to the emergency room.
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
## 2024-12-01 ENCOUNTER — Emergency Department (HOSPITAL_COMMUNITY)
Admission: EM | Admit: 2024-12-01 | Discharge: 2024-12-02 | Disposition: A | Discharge status: Home / Self Care | Attending: Emergency Medicine | Admitting: Emergency Medicine

## 2024-12-01 ENCOUNTER — Encounter (HOSPITAL_COMMUNITY): Payer: Self-pay

## 2024-12-01 ENCOUNTER — Ambulatory Visit (HOSPITAL_COMMUNITY)
Admission: EM | Admit: 2024-12-01 | Discharge: 2024-12-01 | Disposition: A | Discharge status: Home / Self Care | Attending: Emergency Medicine | Admitting: Emergency Medicine

## 2024-12-01 ENCOUNTER — Emergency Department (HOSPITAL_COMMUNITY)

## 2024-12-01 ENCOUNTER — Other Ambulatory Visit: Payer: Self-pay

## 2024-12-01 DIAGNOSIS — N898 Other specified noninflammatory disorders of vagina: Secondary | ICD-10-CM | POA: Diagnosis not present

## 2024-12-01 DIAGNOSIS — R109 Unspecified abdominal pain: Secondary | ICD-10-CM | POA: Diagnosis not present

## 2024-12-01 DIAGNOSIS — R809 Proteinuria, unspecified: Secondary | ICD-10-CM | POA: Diagnosis not present

## 2024-12-01 DIAGNOSIS — R112 Nausea with vomiting, unspecified: Secondary | ICD-10-CM | POA: Insufficient documentation

## 2024-12-01 DIAGNOSIS — R1032 Left lower quadrant pain: Secondary | ICD-10-CM | POA: Diagnosis present

## 2024-12-01 DIAGNOSIS — D72829 Elevated white blood cell count, unspecified: Secondary | ICD-10-CM | POA: Insufficient documentation

## 2024-12-01 LAB — CBC WITH DIFFERENTIAL/PLATELET
Abs Immature Granulocytes: 0.07 K/uL (ref 0.00–0.07)
Basophils Absolute: 0.1 K/uL (ref 0.0–0.1)
Basophils Relative: 0 %
Eosinophils Absolute: 0.1 K/uL (ref 0.0–0.5)
Eosinophils Relative: 1 %
HCT: 42.2 % (ref 36.0–46.0)
Hemoglobin: 14.4 g/dL (ref 12.0–15.0)
Immature Granulocytes: 1 %
Lymphocytes Relative: 25 %
Lymphs Abs: 3.6 K/uL (ref 0.7–4.0)
MCH: 32 pg (ref 26.0–34.0)
MCHC: 34.1 g/dL (ref 30.0–36.0)
MCV: 93.8 fL (ref 80.0–100.0)
Monocytes Absolute: 0.9 K/uL (ref 0.1–1.0)
Monocytes Relative: 6 %
Neutro Abs: 9.9 K/uL — ABNORMAL HIGH (ref 1.7–7.7)
Neutrophils Relative %: 67 %
Platelets: 338 K/uL (ref 150–400)
RBC: 4.5 MIL/uL (ref 3.87–5.11)
RDW: 12.9 % (ref 11.5–15.5)
WBC: 14.7 K/uL — ABNORMAL HIGH (ref 4.0–10.5)
nRBC: 0 % (ref 0.0–0.2)

## 2024-12-01 LAB — URINALYSIS, ROUTINE W REFLEX MICROSCOPIC
Bacteria, UA: NONE SEEN
Bilirubin Urine: NEGATIVE
Glucose, UA: NEGATIVE mg/dL
Ketones, ur: 5 mg/dL — AB
Leukocytes,Ua: NEGATIVE
Nitrite: NEGATIVE
Protein, ur: 100 mg/dL — AB
Specific Gravity, Urine: 1.02 (ref 1.005–1.030)
pH: 5 (ref 5.0–8.0)

## 2024-12-01 LAB — POCT URINE DIPSTICK
Bilirubin, UA: NEGATIVE
Glucose, UA: NEGATIVE mg/dL
Ketones, POC UA: NEGATIVE mg/dL
Leukocytes, UA: NEGATIVE
Nitrite, UA: NEGATIVE
POC PROTEIN,UA: >=300 — AB
Spec Grav, UA: >=1.03 — AB
Urobilinogen, UA: 0.2 U/dL
pH, UA: 6

## 2024-12-01 LAB — COMPREHENSIVE METABOLIC PANEL WITH GFR
ALT: 26 U/L (ref 0–44)
AST: 29 U/L (ref 15–41)
Albumin: 4.5 g/dL (ref 3.5–5.0)
Alkaline Phosphatase: 91 U/L (ref 38–126)
Anion gap: 13 (ref 5–15)
BUN: 10 mg/dL (ref 6–20)
CO2: 21 mmol/L — ABNORMAL LOW (ref 22–32)
Calcium: 9.2 mg/dL (ref 8.9–10.3)
Chloride: 102 mmol/L (ref 98–111)
Creatinine, Ser: 0.73 mg/dL (ref 0.44–1.00)
GFR, Estimated: >60 mL/min
Glucose, Bld: 96 mg/dL (ref 70–99)
Potassium: 3.7 mmol/L (ref 3.5–5.1)
Sodium: 136 mmol/L (ref 135–145)
Total Bilirubin: 0.6 mg/dL (ref 0.0–1.2)
Total Protein: 7.8 g/dL (ref 6.5–8.1)

## 2024-12-01 LAB — HIV ANTIBODY (ROUTINE TESTING W REFLEX): HIV Screen 4th Generation wRfx: NONREACTIVE

## 2024-12-01 LAB — LIPASE, BLOOD: Lipase: 20 U/L (ref 11–51)

## 2024-12-01 LAB — WET PREP, GENITAL
Clue Cells Wet Prep HPF POC: NONE SEEN
Sperm: NONE SEEN
Trich, Wet Prep: NONE SEEN
WBC, Wet Prep HPF POC: <10
Yeast Wet Prep HPF POC: NONE SEEN

## 2024-12-01 LAB — HCG, SERUM, QUALITATIVE: Preg, Serum: NEGATIVE

## 2024-12-01 LAB — POCT URINE PREGNANCY: Preg Test, Ur: NEGATIVE

## 2024-12-01 NOTE — ED Notes (Signed)
CT notified of IV.

## 2024-12-01 NOTE — ED Provider Triage Note (Signed)
 Emergency Medicine Provider Triage Evaluation Note  Rhonda Sparks , a 24 y.o. female  was evaluated in triage.  Pt complains of left flank pain and abdomen pain that began earlier today.  Also notes she is having some suprapubic pain and vaginal discharge.  She was seen in urgent care and sent to ED for evaluation.  Notes associated nausea/vomiting.  Was told she had blood in her urine and concerns for PID.  Review of Systems  Positive: Nausea/vomiting, abdominal pain, vaginal discharge Negative: Fevers, diarrhea  Physical Exam  BP (!) 135/93 (BP Location: Right Arm)   Pulse 77   Temp 98 F (36.7 C)   Resp 16   Ht 5' 1 (1.549 m)   Wt 99.8 kg   LMP 11/09/2024 (Approximate)   SpO2 100%   BMI 41.57 kg/m  Gen:   Awake, no distress   Resp:  Normal effort  MSK:   Moves extremities without difficulty  Other:    Medical Decision Making  Medically screening exam initiated at 9:53 PM.  Appropriate orders placed.  Rhonda Sparks was informed that the remainder of the evaluation will be completed by another provider, this initial triage assessment does not replace that evaluation, and the importance of remaining in the ED until their evaluation is complete.     Neysa Thersia RAMAN, [place] 12/01/24 2155

## 2024-12-01 NOTE — ED Notes (Signed)
 Patient is being discharged from the Urgent Care and sent to the Emergency Department via private vehicle. Per provider, patient is in need of higher level of care due to severe abd pain. Patient is aware and verbalizes understanding of plan of care.  Vitals:   12/01/24 2014  BP: (!) 134/93  Pulse: 87  Resp: 16  Temp: 98.9 F (37.2 C)  SpO2: 97%

## 2024-12-01 NOTE — Discharge Instructions (Addendum)
 Your urine is negative for pregnancy and UTI.  You have been tested for STI, HIV,RPR, results pending. You have protein and blood in your urine. Go to the Er for further evaluation of severe abdominal pain, we are unable to perform imaging or stat labs in urgent care. Avoid anything by mouth until seen by Er provider

## 2024-12-01 NOTE — ED Notes (Signed)
 Patient is being discharged from the Urgent Care and sent to the Emergency Department via POV . Per Rilla, NP, patient is in need of higher level of care due to need of further evaluation. Patient is aware and verbalizes understanding of plan of care.  Vitals:   12/01/24 2014  BP: (!) 134/93  Pulse: 87  Resp: 16  Temp: 98.9 F (37.2 C)  SpO2: 97%
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
## 2024-12-01 NOTE — ED Triage Notes (Signed)
 Patient here today with c/o lower abd pain and left side low back pain that radiates around to the front about an hour ago. Patient has taken Ibuprofen  with no relief. Patient has some urge to urinate prior to the pain starting.

## 2024-12-01 NOTE — ED Triage Notes (Signed)
 Pt to ED from UC c/o left side flank pain, reports UA was performed at [facility] and negative for UTI

## 2024-12-01 NOTE — ED Triage Notes (Signed)
 Pt arrive POV send by UC to get further evaluation for Lower abd pain and flank pain.

## 2024-12-02 LAB — CERVICOVAGINAL ANCILLARY ONLY
Bacterial Vaginitis (gardnerella): NEGATIVE
Candida Glabrata: NEGATIVE
Candida Vaginitis: NEGATIVE
Chlamydia: NEGATIVE
Comment: NEGATIVE
Comment: NEGATIVE
Comment: NEGATIVE
Comment: NEGATIVE
Comment: NEGATIVE
Comment: NORMAL
Neisseria Gonorrhea: NEGATIVE
Trichomonas: NEGATIVE

## 2024-12-02 LAB — SYPHILIS: RPR W/REFLEX TO RPR TITER AND TREPONEMAL ANTIBODIES, TRADITIONAL SCREENING AND DIAGNOSIS ALGORITHM: RPR Ser Ql: NONREACTIVE

## 2024-12-02 MED ORDER — DOXYCYCLINE HYCLATE 100 MG PO CAPS: 100.0000 mg | ORAL_CAPSULE | Freq: Two times a day (BID) | ORAL | 0 refills | Status: AC

## 2024-12-02 MED ORDER — IOHEXOL 350 MG/ML SOLN
75.0000 mL | Freq: Once | INTRAVENOUS | Status: AC | PRN
  Administered 2024-12-02: 75 mL via INTRAVENOUS

## 2024-12-02 MED ORDER — LIDOCAINE HCL (PF) 1 % IJ SOLN
INTRAMUSCULAR | Status: AC
  Administered 2024-12-02: 5 mL
  Filled 2024-12-02: qty 5

## 2024-12-02 MED ORDER — CEFTRIAXONE SODIUM 500 MG IJ SOLR
500.0000 mg | Freq: Once | INTRAMUSCULAR | Status: AC
  Administered 2024-12-02: 500 mg via INTRAMUSCULAR
  Filled 2024-12-02: qty 500

## 2024-12-02 MED ORDER — DOXYCYCLINE HYCLATE 100 MG PO TABS
100.0000 mg | ORAL_TABLET | Freq: Once | ORAL | Status: AC
  Administered 2024-12-02: 100 mg via ORAL
  Filled 2024-12-02: qty 1

## 2024-12-02 NOTE — Discharge Instructions (Addendum)
 Take Doxycycline  as prescribed and complete the full course. Follow up in your mychart for your test results. Abstain from intercourse until you know your test results and partner(s) are treated if positive.  Follow up with your primary care provider for recheck in 2 days.  Return to the ER for worsening pain, fever, other concerning symptoms.

## 2024-12-02 NOTE — ED Provider Notes (Signed)
 " [place] [facility] Provider Note   CSN: [id number] Arrival date & time: 12/01/24  2136     Patient presents with: Flank Pain   Rhonda Sparks is a 24 y.o. female.   24 yo female presents with complaint of LLQ pain onset earlier today. Associated with yellow vaginal dc, nausea and vomiting. Went to UC where STI labs were sent out, found to have blood in her urine and sent to the ER for further evaluation. No fevers. Patient is sexually active with a new partner, no reports of symptoms in partner. History of PID.        Prior to Admission medications  Medication Sig Start Date End Date Taking? Authorizing Provider  doxycycline  (VIBRAMYCIN ) 100 MG capsule Take 1 capsule (100 mg total) by mouth 2 (two) times daily for 7 days. 12/02/24 12/09/24 Yes Beverley Leita LABOR, PA-C  acetaminophen  (TYLENOL ) 325 MG tablet Take 2 tablets (650 mg total) by mouth every 6 (six) hours as needed for fever. Patient not taking: Reported on 08/24/2024 01/09/23   Constant, Peggy, MD  albuterol  (VENTOLIN  HFA) 108 (90 Base) MCG/ACT inhaler Inhale 1-2 puffs into the lungs every 6 (six) hours as needed for wheezing or shortness of breath. 11/13/24   Enedelia Dorna HERO, FNP  spironolactone  (ALDACTONE ) 100 MG tablet Take 1 tablet (100 mg total) by mouth daily. 08/24/24   Alm Delon SAILOR, DO    Allergies: Patient has no known allergies.    Review of Systems Negative except as per HPI Updated Vital Signs BP 120/69 (BP Location: Right Arm)   Pulse 85   Temp 98.2 F (36.8 C) (Oral)   Resp 16   Ht 5' 1 (1.549 m)   Wt 99.8 kg   LMP 11/09/2024 (Approximate)   SpO2 98%   BMI 41.57 kg/m   Physical Exam Vitals and nursing note reviewed.  Constitutional:      General: She is not in acute distress.    Appearance: She is well-developed. She is not diaphoretic.  HENT:     Head: Normocephalic and atraumatic.  Pulmonary:     Effort: Pulmonary effort is normal.   Abdominal:     Palpations: Abdomen is soft.     Tenderness: There is abdominal tenderness in the left lower quadrant. There is no guarding or rebound.     Comments: Mild TTP LLQ  Skin:    General: Skin is warm and dry.     Findings: No erythema or rash.  Neurological:     Mental Status: She is alert and oriented to person, place, and time.  Psychiatric:        Behavior: Behavior normal.     (all labs ordered are listed, but only abnormal results are displayed) Labs Reviewed  COMPREHENSIVE METABOLIC PANEL WITH GFR - Abnormal; Notable for the following components:      Result Value   CO2 21 (*)    All other components within normal limits  CBC WITH DIFFERENTIAL/PLATELET - Abnormal; Notable for the following components:   WBC 14.7 (*)    Neutro Abs 9.9 (*)    All other components within normal limits  URINALYSIS, ROUTINE W REFLEX MICROSCOPIC - Abnormal; Notable for the following components:   APPearance HAZY (*)    Hgb urine dipstick MODERATE (*)    Ketones, ur 5 (*)    Protein, ur 100 (*)    All other components within normal limits  WET PREP, GENITAL  LIPASE, BLOOD  HCG, SERUM, QUALITATIVE    EKG: None  Radiology: CT ABDOMEN PELVIS W CONTRAST Result Date: 12/02/2024 EXAM: CT ABDOMEN AND PELVIS WITH CONTRAST 12/02/2024 12:05:30 AM TECHNIQUE: CT of the abdomen and pelvis was performed with the administration of 75 mL of iohexol  (OMNIPAQUE ) 350 MG/ML injection. Multiplanar reformatted images are provided for review. Automated exposure control, iterative reconstruction, and/or weight-based adjustment of the mA/kV was utilized to reduce the radiation dose to as low as reasonably achievable. COMPARISON: CT Abdomen and Pelvis dated 09/06/2023. CLINICAL HISTORY: Left-sided abdominal pain. FINDINGS: LOWER CHEST: No acute abnormality. LIVER: Mild fatty infiltration of the liver is noted. GALLBLADDER AND BILE DUCTS: Gallbladder is unremarkable. No biliary ductal dilatation. SPLEEN: No  acute abnormality. PANCREAS: No acute abnormality. ADRENAL GLANDS: No acute abnormality. KIDNEYS, URETERS AND BLADDER: No stones in the kidneys or ureters. No hydronephrosis. No perinephric or periureteral stranding. Urinary bladder is unremarkable. GI AND BOWEL: Stomach demonstrates no acute abnormality. The appendix is within normal limits. There is no bowel obstruction. PERITONEUM AND RETROPERITONEUM: No free fluid is seen. No free air. VASCULATURE: Aorta is normal in caliber. LYMPH NODES: No lymphadenopathy. REPRODUCTIVE ORGANS: The uterus is within normal limits. 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 No focal soft tissue abnormality. IMPRESSION: 1. No acute findings in the abdomen or pelvis. Electronically signed by: Oneil Devonshire MD 12/02/2024 12:14 AM EST RP Workstation: [id number]     Procedures   Medications Ordered in the ED  iohexol  (OMNIPAQUE ) 350 MG/ML injection 75 mL (75 mLs Intravenous Contrast Given 12/02/24 0006)  cefTRIAXone  (ROCEPHIN ) injection 500 mg (500 mg Intramuscular Given 12/02/24 0153)  doxycycline  (VIBRA -TABS) tablet 100 mg (100 mg Oral Given 12/02/24 0153)  lidocaine  (PF) (XYLOCAINE ) 1 % injection (5 mLs  Given 12/02/24 0153)                                    Medical Decision Making  This patient presents to the ED for concern of LLQ pain, this involves an extensive number of treatment options, and is a complaint that carries with it a high risk of complications and morbidity.  The differential diagnosis includes but not limited to pyelonephritis, kidney stone, UTI, PID, ovarian cyst, torsion, constipation, colitis.    Co morbidities / Chronic conditions that complicate the patient evaluation  PID, constipation   Additional history obtained:  Additional history obtained from EMR External records from outside source obtained and reviewed including prior labs and imaging    Lab Tests:  I Ordered,  and personally interpreted labs.  The pertinent results include: hCG.  Urinalysis with moderate hemoglobin and RBC, negative for nitrates and leukocytes, is a contaminated sample, not currently on menstrual cycle.  CBC with mild considers white count of 14.7 with increase in neutrophils.  CMP without significant findings.  Lipase normal.  Wet prep negative for BV, trichomoniasis, yeast.   Imaging Studies ordered:  I ordered imaging studies including CT abdomen pelvis I independently visualized and interpreted imaging which showed no acute pathology I agree with the radiologist interpretation   Problem List / ED Course / Critical interventions / Medication management  24 year old female with complaint of left lower quadrant pain.  Found a mild tenderness on exam in the left lower quadrant.  Does have microscopic hematuria on urinalysis however CT negative for stone.  Nonspecific leukocytosis, patient afebrile.  Wet prep is  negative for BV, trichomoniasis, yeast.  STI panel sent out at urgent care and pending.  Plan is to cover with Doxy and Rocephin .  Advised abstain from intercourse until all test results are known and parties treated.  Recheck with primary care in 2 days.  Return to ER for fever, worsening pain or other concerning symptoms. I ordered medication including rocephin , doxy   Reevaluation of the patient after these medicines showed that the patient stable  I have reviewed the patients home medicines and have made adjustments as needed   Social Determinants of Health:  Has PCP   Test / Admission - Considered:  Stable for dc      Final diagnoses:  Left lower quadrant abdominal pain    ED Discharge Orders          Ordered    doxycycline  (VIBRAMYCIN ) 100 MG capsule  2 times daily        12/02/24 0143               Beverley Leita LABOR, PA-C 12/02/24 0217    Griselda Norris, MD 12/02/24 0315  "

## 2025-01-27 ENCOUNTER — Ambulatory Visit: Admitting: Dermatology
